# Patient Record
Sex: Female | Born: 1955 | Race: White | Hispanic: No | Marital: Married | Smoking: Never smoker
Health system: Southern US, Community
[De-identification: ages and names within clinical notes are randomized; demographics above are authoritative.]

## PROBLEM LIST (undated history)

## (undated) DIAGNOSIS — T7840XA Allergy, unspecified, initial encounter: Secondary | ICD-10-CM

## (undated) DIAGNOSIS — D219 Benign neoplasm of connective and other soft tissue, unspecified: Secondary | ICD-10-CM

## (undated) DIAGNOSIS — O00109 Unspecified tubal pregnancy without intrauterine pregnancy: Secondary | ICD-10-CM

## (undated) HISTORY — DX: Benign neoplasm of connective and other soft tissue, unspecified: D21.9

## (undated) HISTORY — PX: OOPHORECTOMY: SHX86

## (undated) HISTORY — DX: Allergy, unspecified, initial encounter: T78.40XA

## (undated) HISTORY — PX: DILATION AND CURETTAGE OF UTERUS: SHX78

## (undated) HISTORY — PX: ABDOMINAL HYSTERECTOMY: SHX81

## (undated) HISTORY — PX: OTHER SURGICAL HISTORY: SHX169

## (undated) HISTORY — DX: Unspecified tubal pregnancy without intrauterine pregnancy: O00.109

## (undated) HISTORY — PX: WISDOM TOOTH EXTRACTION: SHX21

---

## 2005-12-07 ENCOUNTER — Ambulatory Visit: Payer: Self-pay | Admitting: Family Medicine

## 2017-01-11 DIAGNOSIS — R030 Elevated blood-pressure reading, without diagnosis of hypertension: Secondary | ICD-10-CM | POA: Insufficient documentation

## 2021-04-23 ENCOUNTER — Encounter: Payer: Self-pay | Admitting: Nurse Practitioner

## 2021-04-23 DIAGNOSIS — R7989 Other specified abnormal findings of blood chemistry: Secondary | ICD-10-CM | POA: Insufficient documentation

## 2021-04-23 DIAGNOSIS — E7800 Pure hypercholesterolemia, unspecified: Secondary | ICD-10-CM | POA: Insufficient documentation

## 2021-05-08 ENCOUNTER — Other Ambulatory Visit: Payer: Self-pay

## 2021-05-08 ENCOUNTER — Ambulatory Visit: Payer: Commercial Managed Care - PPO | Admitting: Nurse Practitioner

## 2021-05-08 ENCOUNTER — Encounter: Payer: Self-pay | Admitting: Nurse Practitioner

## 2021-05-08 VITALS — BP 132/80 | Temp 98.6°F | Ht 65.0 in | Wt 151.0 lb

## 2021-05-08 DIAGNOSIS — Z1211 Encounter for screening for malignant neoplasm of colon: Secondary | ICD-10-CM

## 2021-05-08 DIAGNOSIS — E7800 Pure hypercholesterolemia, unspecified: Secondary | ICD-10-CM | POA: Diagnosis not present

## 2021-05-08 DIAGNOSIS — Z7689 Persons encountering health services in other specified circumstances: Secondary | ICD-10-CM

## 2021-05-08 DIAGNOSIS — L600 Ingrowing nail: Secondary | ICD-10-CM

## 2021-05-08 DIAGNOSIS — Z1283 Encounter for screening for malignant neoplasm of skin: Secondary | ICD-10-CM

## 2021-05-08 DIAGNOSIS — R030 Elevated blood-pressure reading, without diagnosis of hypertension: Secondary | ICD-10-CM

## 2021-05-08 DIAGNOSIS — Z1231 Encounter for screening mammogram for malignant neoplasm of breast: Secondary | ICD-10-CM

## 2021-05-08 DIAGNOSIS — Z780 Asymptomatic menopausal state: Secondary | ICD-10-CM

## 2021-05-08 MED ORDER — SHINGRIX 50 MCG/0.5ML IM SUSR: 0.5000 mL | Freq: Once | INTRAMUSCULAR | 0 refills | Status: AC

## 2021-05-08 NOTE — Assessment & Plan Note (Signed)
Wishes referral to Dr. Sharlotte Alamo at [facility] to address, she has seen him before.

## 2021-05-08 NOTE — Assessment & Plan Note (Signed)
Chronic, ongoing issue on review of chart, she checks BP at home and is often <130/80.  Stable on check today.  Recommend she continue to monitor at home and focus on DASH diet.  Return in 4 weeks for physical.

## 2021-05-08 NOTE — Progress Notes (Signed)
New Patient Office Visit  Subjective:  Patient ID: Christina Manning, female    DOB: [birth year]  Age: 65 y.o. MRN: [id number]  CC:  Chief Complaint  Patient presents with   Establish Care    Patient states she is here to have her screenings such as mammograms and etc ordered updated as COVID has put her behind as far as being on track with her health. Patient states she is not concerned much about blood pressure and etc as she is more concerned about mammogram and bone density. Patient states the last PCP was 3 years ago.     HPI Christina Manning presents for new patient visit to establish care.  Introduced to Designer, jewellery role and practice setting.  All questions answered.  Discussed provider/patient relationship and expectations.  Has not seen PCP in 3 years, got set behind due to Covid.  She would like full lab work -- physical.  She prefers not to take medications for anything, prefers supplements.  Rides horses daily and states dentist told her she had some bone loss in jaw, so would like bone density testing.    She requests referral to go back and see Dr. Sharlotte Alamo for her ingrown toenails and dermatology referral.    Has history of white coat hypertension noted on past notes and problem list, is refusing blood pressure check today by CMA or provider.  At length discussion on this.  Last followed by [facility] -- last labs reviewed from 2018 noting LDL 128, TSH 3.624, WBC 4.1.  Last mammogram was in 2018 and she wishes to repeat this.  Past Medical History:  Diagnosis Date   Ectopic pregnancy, tubal    Fibroids     Past Surgical History:  Procedure Laterality Date   ABDOMINAL HYSTERECTOMY     DILATION AND CURETTAGE OF UTERUS     Fallopian Tube Surgery     OOPHORECTOMY     WISDOM TOOTH EXTRACTION      Family History  Problem Relation Age of Onset   Mental illness Mother    Non-Hodgkin's lymphoma Mother    Brain cancer Mother    Lupus Father    Healthy  Brother    Heart Problems Daughter    Healthy Son    Healthy Son     Social History   Socioeconomic History   Marital status: Married    Spouse name: Not on file   Number of children: 3   Years of education: Not on file   Highest education level: Not on file  Occupational History   Not on file  Tobacco Use   Smoking status: Never   Smokeless tobacco: Never  Substance and Sexual Activity   Alcohol use: Never   Drug use: Not Currently   Sexual activity: Not Currently  Other Topics Concern   Not on file  Social History Narrative   Not on file   Social Determinants of Health   Financial Resource Strain: Low Risk    Difficulty of Paying Living Expenses: Not hard at all  Food Insecurity: No Food Insecurity   Worried About Charity fundraiser in the Last Year: Never true   [place] in the Last Year: Never true  Transportation Needs: No Transportation Needs   Lack of Transportation (Medical): No   Lack of Transportation (Non-Medical): No  Physical Activity: Sufficiently Active   Days of Exercise per Week: 7 days   Minutes of Exercise per Session: 60  min  Stress: No Stress Concern Present   Feeling of Stress : Not at all  Social Connections: Moderately Isolated   Frequency of Communication with Friends and Family: More than three times a week   Frequency of Social Gatherings with Friends and Family: More than three times a week   Attends Religious Services: Never   Marine scientist or Organizations: No   Attends Music therapist: Never   Marital Status: Married  Human resources officer Violence: Not At Risk   Fear of Current or Ex-Partner: No   Emotionally Abused: No   Physically Abused: No   Sexually Abused: No    ROS Review of Systems  Constitutional:  Negative for activity change, appetite change, diaphoresis, fatigue and fever.  Respiratory:  Negative for cough, chest tightness and shortness of breath.   Cardiovascular:  Negative for chest  pain, palpitations and leg swelling.  Gastrointestinal:  Negative for abdominal distention, abdominal pain, constipation, diarrhea, nausea and vomiting.  Endocrine: Negative for cold intolerance, heat intolerance, polydipsia, polyphagia and polyuria.  Neurological:  Negative for dizziness, syncope, weakness, light-headedness, numbness and headaches.  Psychiatric/Behavioral: Negative.     Objective:   Today's Vitals: BP 132/80 (BP Location: Left Arm, Patient Position: Sitting)   Temp 98.6 F (37 C) (Oral)   Ht '5\' 5"'$  (1.651 m)   Wt 151 lb (68.5 kg)   BMI 25.13 kg/m   Physical Exam Vitals and nursing note reviewed.  Constitutional:      General: She is awake. She is not in acute distress.    Appearance: She is well-developed and well-groomed. She is not ill-appearing or toxic-appearing.  HENT:     Head: Normocephalic.     Right Ear: Hearing normal.     Left Ear: Hearing normal.     Nose: Nose normal.  Eyes:     General: Lids are normal.        Right eye: No discharge.        Left eye: No discharge.     Conjunctiva/sclera: Conjunctivae normal.     Pupils: Pupils are equal, round, and reactive to light.  Neck:     Thyroid: No thyromegaly.     Vascular: No carotid bruit.  Cardiovascular:     Rate and Rhythm: Normal rate and regular rhythm.     Heart sounds: Normal heart sounds. No murmur heard.   No gallop.  Pulmonary:     Effort: Pulmonary effort is normal. No accessory muscle usage or respiratory distress.     Breath sounds: Normal breath sounds.  Abdominal:     General: Bowel sounds are normal.     Palpations: Abdomen is soft.  Musculoskeletal:     Cervical back: Normal range of motion and neck supple.     Right lower leg: No edema.     Left lower leg: No edema.  Skin:    General: Skin is warm and dry.  Neurological:     Mental Status: She is alert and oriented to person, place, and time.  Psychiatric:        Attention and Perception: Attention normal.        Mood  and Affect: Mood normal.        Speech: Speech normal.        Behavior: Behavior normal. Behavior is cooperative.        Thought Content: Thought content normal.    Assessment & Plan:   Problem List Items Addressed This Visit  Cardiovascular and Mediastinum   White coat syndrome without diagnosis of hypertension    Chronic, ongoing issue on review of chart, she checks BP at home and is often <130/80.  Stable on check today.  Recommend she continue to monitor at home and focus on DASH diet.  Return in 4 weeks for physical.        Musculoskeletal and Integument   Ingrown toenail of both feet    Wishes referral to Dr. Sharlotte Alamo at [facility] to address, she has seen him before.      Relevant Orders   Ambulatory referral to Podiatry     Other   Elevated low density lipoprotein (LDL) cholesterol level    Noted on past labs from 2018, recommend diet focus at home and will check fasting labs at physical in 4 weeks.      Skin exam, screening for cancer    Referral to dermatology per patient request to continue annual screening.      Relevant Orders   Ambulatory referral to Dermatology   Other Visit Diagnoses     Encounter to establish care    -  Primary   Postmenopausal estrogen deficiency       DEXA scan ordered per patient request.   Relevant Orders   DG Bone Density   Encounter for screening mammogram for malignant neoplasm of breast       Mammogram ordered.   Relevant Orders   MM 3D SCREEN BREAST BILATERAL   Colon cancer screening       Cologuard ordered and discussed with patient.   Relevant Orders   Cologuard       Outpatient Encounter Medications as of 05/08/2021  Medication Sig   ascorbic acid (VITAMIN C) 500 MG tablet Take by mouth.   Coenzyme Q10 10 MG capsule Take by mouth.   glucosamine-chondroitin 500-400 MG tablet Take by mouth.   Magnesium Gluconate 550 MG TABS Take by mouth.   Multiple Vitamin (MULTI-VITAMINS) TABS Take 1 tablet by mouth  daily.   Omega-3 1000 MG CAPS Take by mouth.   Turmeric, Curcuma Longa, (CURCUMIN) POWD 2 capsules by Miscellaneous route Two (2) times a day.   Zoster Vaccine Adjuvanted [facility]) injection Inject 0.5 mLs into the muscle once for 1 dose. Dose # 1   [START ON 10/14/2021] Zoster Vaccine Adjuvanted [facility]) injection Inject 0.5 mLs into the muscle once for 1 dose. Dose # 2   No facility-administered encounter medications on file as of 05/08/2021.    Follow-up: Return in about 4 weeks (around 06/05/2021) for Annual physical.   Venita Lick, NP

## 2021-05-08 NOTE — Patient Instructions (Signed)
[facility]  Address: [address], [place], [place] [address]  Phone: [phone]  Mammogram A mammogram is an X-ray of the breasts. This is done to check for changes that are not normal. This test can look for changes that may be caused by breast cancer or other problems. Mammograms are regularly done on women beginning at age 64. A man may have a mammogram if he has a lump or swelling in his breast. Tell a doctor: About any allergies you have. If you have breast implants. If you have had breast disease, biopsy, or surgery. If you have a family history of breast cancer. If you are breastfeeding. Whether you are pregnant or may be pregnant. What are the risks? Generally, this is a safe procedure. But problems may occur, including: Being exposed to radiation. Radiation levels are very low with this test. The need for more tests. The results were not read properly. Trouble finding breast cancer in women with dense breasts. What happens before the test? Have this test done about 1-2 weeks after your menstrual period. This is often when your breasts are the least tender. If you are visiting a new doctor or clinic, have any past mammogram images sent to your new doctor's office. Wash your breasts and under your arms on the day of the test. Do not use deodorants, perfumes, lotions, or powders on the day of the test. Take off any jewelry from your neck. Wear clothes that you can change into and out of easily. What happens during the test?  You will take off your clothes from the waist up. You will put on a gown. You will stand in front of the X-ray machine. Each breast will be placed between two plastic or glass plates. The plates will press down on your breast for a few seconds. Try to relax. This does not cause any harm to your breasts. It may not feel comfortable, but it will be very brief. X-rays will be taken from different angles of each  breast. The procedure may vary among doctors and hospitals. What can I expect after the test? The mammogram will be read by a specialist (radiologist). You may need to do parts of the test again. This depends on the quality of the images. You may go back to your normal activities. It is up to you to get the results of your test. Ask how to get your results when they are ready. Summary A mammogram is an X-ray of the breasts. It looks for changes that may be caused by breast cancer or other problems. A man may have this test if he has a lump or swelling in his breast. Before the test, tell your doctor about any breast problems that you have had in the past. Have this test done about 1-2 weeks after your menstrual period. Ask when your test results will be ready. Make sure you get your test results. This information is not intended to replace advice given to you by your health care provider. Make sure you discuss any questions you have with your health care provider. Document Revised: 07/14/2020 Document Reviewed: 07/14/2020 Elsevier Patient Education  2022 Elsevier Inc.   

## 2021-05-08 NOTE — Assessment & Plan Note (Signed)
Noted on past labs from 2018, recommend diet focus at home and will check fasting labs at physical in 4 weeks.

## 2021-05-08 NOTE — Assessment & Plan Note (Signed)
Referral to dermatology per patient request to continue annual screening.

## 2021-05-25 LAB — COLOGUARD: Cologuard: NEGATIVE

## 2021-05-25 NOTE — Progress Notes (Signed)
Contacted via MyChart   Good morning Tehani, your Cologuard returned negative.  This means next screening will be in 3 years.  Great news!!! Keep being stellar!!  Thank you for allowing me to participate in your care.  I appreciate you. Kindest regards, Raylei Losurdo

## 2021-06-10 ENCOUNTER — Other Ambulatory Visit: Payer: Self-pay

## 2021-06-10 ENCOUNTER — Encounter: Payer: Self-pay | Admitting: Nurse Practitioner

## 2021-06-10 ENCOUNTER — Ambulatory Visit (INDEPENDENT_AMBULATORY_CARE_PROVIDER_SITE_OTHER): Payer: Commercial Managed Care - PPO | Admitting: Nurse Practitioner

## 2021-06-10 VITALS — BP 130/80 | HR 88 | Resp 18 | Ht 64.0 in | Wt 148.0 lb

## 2021-06-10 DIAGNOSIS — Z0000 Encounter for general adult medical examination without abnormal findings: Secondary | ICD-10-CM | POA: Diagnosis not present

## 2021-06-10 DIAGNOSIS — E7800 Pure hypercholesterolemia, unspecified: Secondary | ICD-10-CM | POA: Diagnosis not present

## 2021-06-10 DIAGNOSIS — R030 Elevated blood-pressure reading, without diagnosis of hypertension: Secondary | ICD-10-CM

## 2021-06-10 LAB — MICROALBUMIN, URINE WAIVED
Creatinine, Urine Waived: 200 mg/dL (ref 10–300)
Microalb, Ur Waived: 80 mg/L — ABNORMAL HIGH (ref 0–19)

## 2021-06-10 NOTE — Progress Notes (Signed)
BP 130/80 (BP Location: Left Arm, Patient Position: Sitting, Cuff Size: Normal)   Pulse 88 Comment: apical  Resp 18   Ht _0  (1.626 m)   Wt 148 lb (67.1 kg)   BMI 25.40 kg/m    Subjective:    Patient ID: Christina Manning, female    DOB: 1956-05-17, 65 y.o.   MRN: [id number]  HPI: Christina Manning is a 65 y.o. female presenting on 06/10/2021 for comprehensive medical examination. Current medical complaints include:none  She currently lives with: husband Menopausal Symptoms: no  BP checks at home average = 111/67 to 159/79 (elevated one was after being stung by bee) -- average 110/70  She would like inflammatory markers checked today -- due to random pain relieved by exercise.  Depression Screen done today and results listed below:  Depression screen [facility] 2/9 06/10/2021 05/08/2021  Decreased Interest 0 0  Down, Depressed, Hopeless 0 1  PHQ - 2 Score 0 1    Fall Risk  06/10/2021 06/10/2021  Falls in the past year? 0 0  Number falls in past yr: 0 0  Injury with Fall? 0 0  Risk for fall due to : No Fall Risks No Fall Risks  Follow up Falls prevention discussed Falls evaluation completed    Functional Status Survey: Is the patient deaf or have difficulty hearing?: No Does the patient have difficulty seeing, even when wearing glasses/contacts?: No Does the patient have difficulty concentrating, remembering, or making decisions?: No Does the patient have difficulty walking or climbing stairs?: No Does the patient have difficulty dressing or bathing?: No Does the patient have difficulty doing errands alone such as visiting a doctor's office or shopping?: No   Past Medical History:  Past Medical History:  Diagnosis Date   Ectopic pregnancy, tubal    Fibroids     Surgical History:  Past Surgical History:  Procedure Laterality Date   ABDOMINAL HYSTERECTOMY     DILATION AND CURETTAGE OF UTERUS     Fallopian Tube Surgery     OOPHORECTOMY     WISDOM TOOTH EXTRACTION       Medications:  Current Outpatient Medications on File Prior to Visit  Medication Sig   ascorbic acid (VITAMIN C) 500 MG tablet Take by mouth.   Coenzyme Q10 10 MG capsule Take by mouth.   glucosamine-chondroitin 500-400 MG tablet Take by mouth.   Magnesium Gluconate 550 MG TABS Take by mouth.   Multiple Vitamin (MULTI-VITAMINS) TABS Take 1 tablet by mouth daily.   Omega-3 1000 MG CAPS Take by mouth.   Turmeric, Curcuma Longa, (CURCUMIN) POWD 2 capsules by Miscellaneous route Two (2) times a day.   [START ON 10/14/2021] Zoster Vaccine Adjuvanted [facility]) injection Inject 0.5 mLs into the muscle once for 1 dose. Dose # 2   No current facility-administered medications on file prior to visit.    Allergies:  Allergies  Allergen Reactions   Sulfa Antibiotics Swelling    Social History:  Social History   Socioeconomic History   Marital status: Married    Spouse name: Not on file   Number of children: 3   Years of education: Not on file   Highest education level: Not on file  Occupational History   Not on file  Tobacco Use   Smoking status: Never   Smokeless tobacco: Never  Substance and Sexual Activity   Alcohol use: Never   Drug use: Not Currently   Sexual activity: Not Currently  Other Topics Concern  Not on file  Social History Narrative   Not on file   Social Determinants of Health   Financial Resource Strain: Low Risk    Difficulty of Paying Living Expenses: Not hard at all  Food Insecurity: No Food Insecurity   Worried About Programme researcher, broadcasting/film/video in the Last Year: Never true   Barista in the Last Year: Never true  Transportation Needs: No Transportation Needs   Lack of Transportation (Medical): No   Lack of Transportation (Non-Medical): No  Physical Activity: Sufficiently Active   Days of Exercise per Week: 7 days   Minutes of Exercise per Session: 60 min  Stress: No Stress Concern Present   Feeling of Stress : Not at all  Social Connections:  Moderately Isolated   Frequency of Communication with Friends and Family: More than three times a week   Frequency of Social Gatherings with Friends and Family: More than three times a week   Attends Religious Services: Never   Database administrator or Organizations: No   Attends Engineer, structural: Never   Marital Status: Married  Catering manager Violence: Not At Risk   Fear of Current or Ex-Partner: No   Emotionally Abused: No   Physically Abused: No   Sexually Abused: No   Social History   Tobacco Use  Smoking Status Never  Smokeless Tobacco Never   Social History   Substance and Sexual Activity  Alcohol Use Never    Family History:  Family History  Problem Relation Age of Onset   Mental illness Mother    Non-Hodgkin's lymphoma Mother    Brain cancer Mother    Lupus Father    Healthy Brother    Heart Problems Daughter    Healthy Son    Healthy Son     Past medical history, surgical history, medications, allergies, family history and social history reviewed with patient today and changes made to appropriate areas of the chart.   ROS All other ROS negative except what is listed above and in the HPI.      Objective:    BP 130/80 (BP Location: Left Arm, Patient Position: Sitting, Cuff Size: Normal)   Pulse 88 Comment: apical  Resp 18   Ht 5\' 4"  (1.626 m)   Wt 148 lb (67.1 kg)   BMI 25.40 kg/m   Wt Readings from Last 3 Encounters:  06/10/21 148 lb (67.1 kg)  05/08/21 151 lb (68.5 kg)    Physical Exam Vitals and nursing note reviewed.  Constitutional:      General: She is awake. She is not in acute distress.    Appearance: She is well-developed and well-groomed. She is not ill-appearing or toxic-appearing.  HENT:     Head: Normocephalic and atraumatic.     Right Ear: Hearing, tympanic membrane, ear canal and external ear normal. No drainage.     Left Ear: Hearing, tympanic membrane, ear canal and external ear normal. No drainage.     Nose:  Nose normal.     Right Sinus: No maxillary sinus tenderness or frontal sinus tenderness.     Left Sinus: No maxillary sinus tenderness or frontal sinus tenderness.     Mouth/Throat:     Mouth: Mucous membranes are moist.     Pharynx: Oropharynx is clear. Uvula midline. No pharyngeal swelling, oropharyngeal exudate or posterior oropharyngeal erythema.  Eyes:     General: Lids are normal.        Right eye: No discharge.  Left eye: No discharge.     Extraocular Movements: Extraocular movements intact.     Conjunctiva/sclera: Conjunctivae normal.     Pupils: Pupils are equal, round, and reactive to light.     Visual Fields: Right eye visual fields normal and left eye visual fields normal.  Neck:     Thyroid: No thyromegaly.     Vascular: No carotid bruit.     Trachea: Trachea normal.  Cardiovascular:     Rate and Rhythm: Normal rate and regular rhythm.     Heart sounds: Normal heart sounds. No murmur heard.   No gallop.  Pulmonary:     Effort: Pulmonary effort is normal. No accessory muscle usage or respiratory distress.     Breath sounds: Normal breath sounds.  Chest:     Comments: Deferred, going for mammogram on 28th. Abdominal:     General: Bowel sounds are normal.     Palpations: Abdomen is soft. There is no hepatomegaly or splenomegaly.     Tenderness: There is no abdominal tenderness.  Musculoskeletal:        General: Normal range of motion.     Cervical back: Normal range of motion and neck supple.     Right lower leg: No edema.     Left lower leg: No edema.  Lymphadenopathy:     Head:     Right side of head: No submental, submandibular, tonsillar, preauricular or posterior auricular adenopathy.     Left side of head: No submental, submandibular, tonsillar, preauricular or posterior auricular adenopathy.     Cervical: No cervical adenopathy.  Skin:    General: Skin is warm and dry.     Capillary Refill: Capillary refill takes less than 2 seconds.     Findings: No  rash.  Neurological:     Mental Status: She is alert and oriented to person, place, and time.     Cranial Nerves: Cranial nerves are intact.     Gait: Gait is intact.     Deep Tendon Reflexes: Reflexes are normal and symmetric.     Reflex Scores:      Brachioradialis reflexes are 2+ on the right side and 2+ on the left side.      Patellar reflexes are 2+ on the right side and 2+ on the left side. Psychiatric:        Attention and Perception: Attention normal.        Mood and Affect: Mood normal.        Speech: Speech normal.        Behavior: Behavior normal. Behavior is cooperative.        Thought Content: Thought content normal.        Judgment: Judgment normal.    Results for orders placed or performed in visit on 05/08/21  Cologuard  Result Value Ref Range   Cologuard Negative Negative      Assessment & Plan:   Problem List Items Addressed This Visit       Cardiovascular and Mediastinum   White coat syndrome without diagnosis of hypertension - Primary    Chronic, ongoing issue on review of chart, she checks BP at home and is often <130/80.  Stable on check today.  Recommend she continue to monitor at home and focus on DASH diet.  LABS today: CBC, CMP, TSH, lipid, CRP, ESR, urine ALB.  Initiate medication as needed.      Relevant Orders   Microalbumin, Urine Waived   CBC with Differential/Platelet   Comprehensive  metabolic panel   TSH   C-reactive protein   Sed Rate (ESR)     Other   Elevated low density lipoprotein (LDL) cholesterol level    Noted on past labs from 2018, recommend diet focus at home and regular activity.  Lipid panel today.      Relevant Orders   Lipid Panel w/o Chol/HDL Ratio   Other Visit Diagnoses     Annual physical exam       Annual labs today, CBC, CMP, TSH, lipid.  Health maintenance reviewed.        Follow up plan: Return in about 1 year (around 06/10/2022) for Annual physical.   LABORATORY TESTING:  - Pap smear: not  applicable  IMMUNIZATIONS:   - Tdap: Tetanus vaccination status reviewed: waiting until next year. - Influenza: Postponed to flu season - Pneumovax: Not applicable -- start at 30 - Prevnar: Not applicable - HPV: Not applicable - Zostavax vaccine:  sent to pharmacy  SCREENING: -Mammogram: Up to date -- scheduled 06/24/21 - Colonoscopy: Up to date -- cologuard -- 05/18/21 - Bone Density: Up to date -- scheduled 06/24/21 -Hearing Test: Not applicable  -Spirometry: Not applicable   PATIENT COUNSELING:   Advised to take 1 mg of folate supplement per day if capable of pregnancy.   Sexuality: Discussed sexually transmitted diseases, partner selection, use of condoms, avoidance of unintended pregnancy  and contraceptive alternatives.   Advised to avoid cigarette smoking.  I discussed with the patient that most people either abstain from alcohol or drink within safe limits (<=14/week and <=4 drinks/occasion for males, <=7/weeks and <= 3 drinks/occasion for females) and that the risk for alcohol disorders and other health effects rises proportionally with the number of drinks per week and how often a drinker exceeds daily limits.  Discussed cessation/primary prevention of drug use and availability of treatment for abuse.   Diet: Encouraged to adjust caloric intake to maintain  or achieve ideal body weight, to reduce intake of dietary saturated fat and total fat, to limit sodium intake by avoiding high sodium foods and not adding table salt, and to maintain adequate dietary potassium and calcium preferably from fresh fruits, vegetables, and low-fat dairy products.    Stressed the importance of regular exercise  Injury prevention: Discussed safety belts, safety helmets, smoke detector, smoking near bedding or upholstery.   Dental health: Discussed importance of regular tooth brushing, flossing, and dental visits.    NEXT PREVENTATIVE PHYSICAL DUE IN 1 YEAR. Return in about 1 year (around  06/10/2022) for Annual physical.

## 2021-06-10 NOTE — Patient Instructions (Addendum)
VACCINES -- start with Covid, then after that get flu, and then after get shingrix, and then tetanus. Pneumonia vaccines to start at age 64.  Healthy Eating Following a healthy eating pattern may help you to achieve and maintain a healthy body weight, reduce the risk of chronic disease, and live a long and productive life. It is important to follow a healthy eating pattern at an appropriate calorie level for your body. Your nutritional needs should be met primarily through food by choosing a variety of nutrient-rich foods. What are tips for following this plan? Reading food labels Read labels and choose the following: Reduced or low sodium. Juices with 100% fruit juice. Foods with low saturated fats and high polyunsaturated and monounsaturated fats. Foods with whole grains, such as whole wheat, cracked wheat, brown rice, and wild rice. Whole grains that are fortified with folic acid. This is recommended for women who are pregnant or who want to become pregnant. Read labels and avoid the following: Foods with a lot of added sugars. These include foods that contain brown sugar, corn sweetener, corn syrup, dextrose, fructose, glucose, high-fructose corn syrup, honey, invert sugar, lactose, malt syrup, maltose, molasses, raw sugar, sucrose, trehalose, or turbinado sugar. Do not eat more than the following amounts of added sugar per day: 6 teaspoons (25 g) for women. 9 teaspoons (38 g) for men. Foods that contain processed or refined starches and grains. Refined grain products, such as white flour, degermed cornmeal, white bread, and white rice. Shopping Choose nutrient-rich snacks, such as vegetables, whole fruits, and nuts. Avoid high-calorie and high-sugar snacks, such as potato chips, fruit snacks, and candy. Use oil-based dressings and spreads on foods instead of solid fats such as butter, stick margarine, or cream cheese. Limit pre-made sauces, mixes, and "instant" products such as flavored  rice, instant noodles, and ready-made pasta. Try more plant-protein sources, such as tofu, tempeh, black beans, edamame, lentils, nuts, and seeds. Explore eating plans such as the Mediterranean diet or vegetarian diet. Cooking Use oil to saut or stir-fry foods instead of solid fats such as butter, stick margarine, or lard. Try baking, boiling, grilling, or broiling instead of frying. Remove the fatty part of meats before cooking. Steam vegetables in water or broth. Meal planning  At meals, imagine dividing your plate into fourths: One-half of your plate is fruits and vegetables. One-fourth of your plate is whole grains. One-fourth of your plate is protein, especially lean meats, poultry, eggs, tofu, beans, or nuts. Include low-fat dairy as part of your daily diet. Lifestyle Choose healthy options in all settings, including home, work, school, restaurants, or stores. Prepare your food safely: Wash your hands after handling raw meats. Keep food preparation surfaces clean by regularly washing with hot, soapy water. Keep raw meats separate from ready-to-eat foods, such as fruits and vegetables. Cook seafood, meat, poultry, and eggs to the recommended internal temperature. Store foods at safe temperatures. In general: Keep cold foods at 22F (4.4C) or below. Keep hot foods at 122F (60C) or above. Keep your freezer at [facility] (-17.8C) or below. Foods are no longer safe to eat when they have been between the temperatures of 40-122F (4.4-60C) for more than 2 hours. What foods should I eat? Fruits Aim to eat 2 cup-equivalents of fresh, canned (in natural juice), or frozen fruits each day. Examples of 1 cup-equivalent of fruit include 1 small apple, 8 large strawberries, 1 cup canned fruit,  cup dried fruit, or 1 cup 100% juice. Vegetables Aim to eat 2-3  cup-equivalents of fresh and frozen vegetables each day, including different varieties and colors. Examples of 1 cup-equivalent of  vegetables include 2 medium carrots, 2 cups raw, leafy greens, 1 cup chopped vegetable (raw or cooked), or 1 medium baked potato. Grains Aim to eat 6 ounce-equivalents of whole grains each day. Examples of 1 ounce-equivalent of grains include 1 slice of bread, 1 cup ready-to-eat cereal, 3 cups popcorn, or  cup cooked rice, pasta, or cereal. Meats and other proteins Aim to eat 5-6 ounce-equivalents of protein each day. Examples of 1 ounce-equivalent of protein include 1 egg, 1/2 cup nuts or seeds, or 1 tablespoon (16 g) peanut butter. A cut of meat or fish that is the size of a deck of cards is about 3-4 ounce-equivalents. Of the protein you eat each week, try to have at least 8 ounces come from seafood. This includes salmon, trout, herring, and anchovies. Dairy Aim to eat 3 cup-equivalents of fat-free or low-fat dairy each day. Examples of 1 cup-equivalent of dairy include 1 cup (240 mL) milk, 8 ounces (250 g) yogurt, 1 ounces (44 g) natural cheese, or 1 cup (240 mL) fortified soy milk. Fats and oils Aim for about 5 teaspoons (21 g) per day. Choose monounsaturated fats, such as canola and olive oils, avocados, peanut butter, and most nuts, or polyunsaturated fats, such as sunflower, corn, and soybean oils, walnuts, pine nuts, sesame seeds, sunflower seeds, and flaxseed. Beverages Aim for six 8-oz glasses of water per day. Limit coffee to three to five 8-oz cups per day. Limit caffeinated beverages that have added calories, such as soda and energy drinks. Limit alcohol intake to no more than 1 drink a day for nonpregnant women and 2 drinks a day for men. One drink equals 12 oz of beer (355 mL), 5 oz of wine (148 mL), or 1 oz of hard liquor (44 mL). Seasoning and other foods Avoid adding excess amounts of salt to your foods. Try flavoring foods with herbs and spices instead of salt. Avoid adding sugar to foods. Try using oil-based dressings, sauces, and spreads instead of solid fats. This  information is based on general [place] nutrition guidelines. For more information, visit [employer]. Exact amounts may vary based on your nutrition needs. Summary A healthy eating plan may help you to maintain a healthy weight, reduce the risk of chronic diseases, and stay active throughout your life. Plan your meals. Make sure you eat the right portions of a variety of nutrient-rich foods. Try baking, boiling, grilling, or broiling instead of frying. Choose healthy options in all settings, including home, work, school, restaurants, or stores. This information is not intended to replace advice given to you by your health care provider. Make sure you discuss any questions you have with your health care provider. Document Revised: 12/26/2017 Document Reviewed: 12/26/2017 Elsevier Patient Education  [employer].

## 2021-06-10 NOTE — Assessment & Plan Note (Addendum)
Chronic, ongoing issue on review of chart, she checks BP at home and is often <130/80.  Stable on check today.  Recommend she continue to monitor at home and focus on DASH diet.  LABS today: CBC, CMP, TSH, lipid, CRP, ESR, urine ALB.  Initiate medication as needed.

## 2021-06-10 NOTE — Assessment & Plan Note (Signed)
Noted on past labs from 2018, recommend diet focus at home and regular activity.  Lipid panel today.

## 2021-06-11 LAB — CBC WITH DIFFERENTIAL/PLATELET
Basophils Absolute: 0 10*3/uL (ref 0.0–0.2)
Basos: 1 %
EOS (ABSOLUTE): 0.1 10*3/uL (ref 0.0–0.4)
Eos: 3 %
Hematocrit: 42.9 % (ref 34.0–46.6)
Hemoglobin: 14.8 g/dL (ref 11.1–15.9)
Immature Grans (Abs): 0 10*3/uL (ref 0.0–0.1)
Immature Granulocytes: 0 %
Lymphocytes Absolute: 1.2 10*3/uL (ref 0.7–3.1)
Lymphs: 26 %
MCH: 32.7 pg (ref 26.6–33.0)
MCHC: 34.5 g/dL (ref 31.5–35.7)
MCV: 95 fL (ref 79–97)
Monocytes Absolute: 0.6 10*3/uL (ref 0.1–0.9)
Monocytes: 13 %
Neutrophils Absolute: 2.8 10*3/uL (ref 1.4–7.0)
Neutrophils: 57 %
Platelets: 223 10*3/uL (ref 150–450)
RBC: 4.53 x10E6/uL (ref 3.77–5.28)
RDW: 12.5 % (ref 11.7–15.4)
WBC: 4.8 10*3/uL (ref 3.4–10.8)

## 2021-06-11 LAB — COMPREHENSIVE METABOLIC PANEL
ALT: 24 IU/L (ref 0–32)
AST: 27 IU/L (ref 0–40)
Albumin/Globulin Ratio: 2.6 — ABNORMAL HIGH (ref 1.2–2.2)
Albumin: 5.5 g/dL — ABNORMAL HIGH (ref 3.8–4.8)
Alkaline Phosphatase: 72 IU/L (ref 44–121)
BUN/Creatinine Ratio: 17 (ref 12–28)
BUN: 14 mg/dL (ref 8–27)
Bilirubin Total: 0.7 mg/dL (ref 0.0–1.2)
CO2: 26 mmol/L (ref 20–29)
Calcium: 10.5 mg/dL — ABNORMAL HIGH (ref 8.7–10.3)
Chloride: 100 mmol/L (ref 96–106)
Creatinine, Ser: 0.84 mg/dL (ref 0.57–1.00)
Globulin, Total: 2.1 g/dL (ref 1.5–4.5)
Glucose: 95 mg/dL (ref 65–99)
Potassium: 4.2 mmol/L (ref 3.5–5.2)
Sodium: 142 mmol/L (ref 134–144)
Total Protein: 7.6 g/dL (ref 6.0–8.5)
eGFR: 78 mL/min/{1.73_m2} (ref >59)

## 2021-06-11 LAB — LIPID PANEL W/O CHOL/HDL RATIO
Cholesterol, Total: 273 mg/dL — ABNORMAL HIGH (ref 100–199)
HDL: 44 mg/dL (ref >39)
LDL Chol Calc (NIH): 179 mg/dL — ABNORMAL HIGH (ref 0–99)
Triglycerides: 261 mg/dL — ABNORMAL HIGH (ref 0–149)
VLDL Cholesterol Cal: 50 mg/dL — ABNORMAL HIGH (ref 5–40)

## 2021-06-11 LAB — C-REACTIVE PROTEIN: CRP: <1 mg/L (ref 0–10)

## 2021-06-11 LAB — SEDIMENTATION RATE: Sed Rate: 2 mm/hr (ref 0–40)

## 2021-06-11 LAB — TSH: TSH: 3.69 u[IU]/mL (ref 0.450–4.500)

## 2021-06-11 NOTE — Progress Notes (Signed)
Contacted via MyChart The 10-year ASCVD risk score (Arnett DK, et al., 2019) is: 6.8%   Values used to calculate the score:     Age: 65 years     Sex: Female     Is Non-Hispanic African American: No     Diabetic: No     Tobacco smoker: No     Systolic Blood Pressure: 224 mmHg     Is BP treated: No     HDL Cholesterol: 44 mg/dL     Total Cholesterol: 273 mg/dL   Good evening Christina Manning, your labs have returned: - CBC is normal and shows no anemia - Kidney function, creatinine and eGFR, is normal.  Liver function, AST and ALT, normal. - Calcium mildly elevated, will continue to monitor this and recheck next visit. - Thyroid is normal.  Inflammation markers are normal. - Your cholesterol is still high, but continued recommendations to make lifestyle changes. Your LDL is above normal. The LDL is the bad cholesterol. Over time and in combination with inflammation and other factors, this contributes to plaque which in turn may lead to stroke and/or heart attack down the road. Sometimes high LDL is primarily genetic, and people might be eating all the right foods but still have high numbers. Other times, there is room for improvement in one's diet and eating healthier can bring this number down and potentially reduce one's risk of heart attack and/or stroke.   To reduce your LDL, Remember - more fruits and vegetables, more fish, and limit red meat and dairy products. More soy, nuts, beans, barley, lentils, oats and plant sterol ester enriched margarine instead of butter. I also encourage eliminating sugar and processed food. Remember, shop on the outside of the grocery store and visit your [employer]. If you would like to talk with me about dietary changes plus or minus medications for your cholesterol, please let me know. We should recheck your cholesterol in 6-12 months.  Any questions? Keep being amazing!!  Thank you for allowing me to participate in your care.  I appreciate  you. Kindest regards, Salome Cozby

## 2021-06-24 ENCOUNTER — Ambulatory Visit
Admission: RE | Admit: 2021-06-24 | Discharge: 2021-06-24 | Disposition: A | Discharge status: Home / Self Care | Payer: Commercial Managed Care - PPO | Source: Ambulatory Visit | Attending: Nurse Practitioner | Admitting: Nurse Practitioner

## 2021-06-24 ENCOUNTER — Other Ambulatory Visit: Payer: Self-pay

## 2021-06-24 DIAGNOSIS — Z1231 Encounter for screening mammogram for malignant neoplasm of breast: Secondary | ICD-10-CM | POA: Insufficient documentation

## 2021-06-24 DIAGNOSIS — Z780 Asymptomatic menopausal state: Secondary | ICD-10-CM | POA: Diagnosis not present

## 2021-06-25 ENCOUNTER — Encounter: Payer: Self-pay | Admitting: Nurse Practitioner

## 2021-06-25 DIAGNOSIS — M85832 Other specified disorders of bone density and structure, left forearm: Secondary | ICD-10-CM | POA: Insufficient documentation

## 2021-06-25 NOTE — Progress Notes (Signed)
Contacted via MyChart   Good morning!!  Your bone density shows thinning bones (osteopenia) but not brittle (osteoporosis). We recommend Vitamin D supplementation of about 2,0000 IUs of over the counter Vitamin D3. In addition, we recommend a diet high in calcium with dairy and dark green leafy vegetables. We would like you to get plenty of weight bearing exercises with walking and resistance training such as light weights or resistance bands available with instructions at places such as [employer]. Plan to repeat this scan in 5 years.  Any questions? Keep being stellar!!  Thank you for allowing me to participate in your care.  I appreciate you. Kindest regards, Jandi Swiger

## 2021-06-26 ENCOUNTER — Inpatient Hospital Stay
Admission: RE | Admit: 2021-06-26 | Discharge: 2021-06-26 | Disposition: A | Discharge status: Home / Self Care | Payer: Self-pay | Source: Ambulatory Visit | Attending: *Deleted | Admitting: *Deleted

## 2021-06-26 ENCOUNTER — Other Ambulatory Visit: Payer: Self-pay | Admitting: *Deleted

## 2021-06-26 DIAGNOSIS — Z1231 Encounter for screening mammogram for malignant neoplasm of breast: Secondary | ICD-10-CM

## 2021-06-30 ENCOUNTER — Other Ambulatory Visit: Payer: Self-pay | Admitting: Nurse Practitioner

## 2021-06-30 DIAGNOSIS — R928 Other abnormal and inconclusive findings on diagnostic imaging of breast: Secondary | ICD-10-CM

## 2021-06-30 DIAGNOSIS — N6489 Other specified disorders of breast: Secondary | ICD-10-CM

## 2021-07-01 ENCOUNTER — Encounter: Payer: Self-pay | Admitting: Dermatology

## 2021-07-09 ENCOUNTER — Ambulatory Visit
Admission: RE | Admit: 2021-07-09 | Discharge: 2021-07-09 | Disposition: A | Discharge status: Home / Self Care | Payer: Commercial Managed Care - PPO | Source: Ambulatory Visit | Attending: Nurse Practitioner | Admitting: Nurse Practitioner

## 2021-07-09 ENCOUNTER — Other Ambulatory Visit: Payer: Self-pay

## 2021-07-09 DIAGNOSIS — N6489 Other specified disorders of breast: Secondary | ICD-10-CM | POA: Insufficient documentation

## 2021-07-09 DIAGNOSIS — R928 Other abnormal and inconclusive findings on diagnostic imaging of breast: Secondary | ICD-10-CM

## 2021-08-24 ENCOUNTER — Encounter: Payer: Self-pay | Admitting: Nurse Practitioner

## 2021-09-11 ENCOUNTER — Ambulatory Visit: Payer: Self-pay | Admitting: *Deleted

## 2021-09-11 NOTE — Telephone Encounter (Signed)
°  Chief Complaint: pulled muscle in arm and tick bite red to right inner thigh  Symptoms: some pain in low back, hips, shoulder, hands. Tick removed 09/02/21 from right inner thigh. Redness noted 09/05/21 and itching.  Frequency: na Pertinent Negatives: Patient denies no fever Disposition: [] ED /[] Urgent Care (no appt availability in office) / [x] Appointment(In office/virtual)/ []   Virtual Care/ [] Home Care/ [] Refused Recommended Disposition  Additional Notes:  Appt scheduled for 09/14/21 .      Reason for Disposition  Wood tick bite with no complications  Answer Assessment - Initial Assessment Questions 1. TYPE of TICK: "Is it a wood tick or a deer tick?" (e.g., deer tick, wood tick; unsure)     Wood tick 2. SIZE of TICK: "How big is the tick?" (e.g., size of poppy seed, apple seed, watermelon seed; unsure) Note: Deer ticks can be the size of a poppy seed (nymph) or an apple seed (adult).       Almost black  3. ENGORGED: "Did the tick look flat or engorged (full, swollen)?" (e.g., flat, engorged; unsure)     Flat  4. LOCATION: "Where is the tick bite located?"      Left inner thigh 5. ONSET: "How long do you think the tick was attached before you removed it?" (e.g., 5 hours, 2 days)      Not sure 6. APPEARANCE of BITE or RASH: "What does the site look like?"     Red  7. PREGNANCY: "Is there any chance you are pregnant?" "When was your last menstrual period?"     na  Protocols used: Tick Bite-A-AH

## 2021-09-14 ENCOUNTER — Encounter: Payer: Self-pay | Admitting: Nurse Practitioner

## 2021-09-14 ENCOUNTER — Other Ambulatory Visit: Payer: Self-pay

## 2021-09-14 ENCOUNTER — Ambulatory Visit: Payer: Commercial Managed Care - PPO | Admitting: Nurse Practitioner

## 2021-09-14 ENCOUNTER — Ambulatory Visit: Payer: Self-pay | Admitting: Dermatology

## 2021-09-14 VITALS — BP 122/76 | HR 80 | Temp 98.4°F | Resp 18 | Wt 155.2 lb

## 2021-09-14 DIAGNOSIS — W57XXXA Bitten or stung by nonvenomous insect and other nonvenomous arthropods, initial encounter: Secondary | ICD-10-CM

## 2021-09-14 DIAGNOSIS — M79601 Pain in right arm: Secondary | ICD-10-CM | POA: Insufficient documentation

## 2021-09-14 DIAGNOSIS — S70361A Insect bite (nonvenomous), right thigh, initial encounter: Secondary | ICD-10-CM | POA: Insufficient documentation

## 2021-09-14 NOTE — Assessment & Plan Note (Signed)
Acute and overall improving at this time with no further symptoms.  Will obtain tick borne disease labs today and treat as needed based on results.  Discussed with patient.

## 2021-09-14 NOTE — Telephone Encounter (Signed)
Sent patient a message via MyChart.

## 2021-09-14 NOTE — Assessment & Plan Note (Signed)
Happened in September to right bicep with no pain or bruising to area.  At this time is stable, but obvious deformity present.  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

## 2021-09-14 NOTE — Patient Instructions (Signed)
Distal Biceps Tendon Tear Rehab Ask your health care provider which exercises are safe for you. Do exercises exactly as told by your health care provider and adjust them as directed. It is normal to feel mild stretching, pulling, tightness, or discomfort as you do these exercises. Stop right away if you feel sudden pain or your pain gets worse. Do not begin these exercises until told by your health care provider. Stretching and range-of-motion exercises These exercises warm up your muscles and joints and improve the movement and flexibility of your arm. The exercises can also help to relieve pain, numbness, and tingling. Passive elbow flexion  Stand or sit with your left / right arm at your side. Use your other hand to gently push your left / right hand toward your shoulder (flexion). Bend your elbow as far as your health care provider tells you to. You may be instructed to try to bend your elbow more and more over time. Hold this position for __________ seconds. Slowly return to the starting position. Repeat __________ times. Complete this exercise __________ times a day. Passive supination This exercise is sometimes called elbow extension stretch. Lie on your back in a comfortable position that allows you to relax your arm muscles. Place a folded towel under your left / right upper arm so your elbow and shoulder are at the same height. Use your other arm to raise your left / right arm until your elbow does not rest on the bed or towel. Hold your left / right arm out straight with your other hand supporting it. Let the weight of your hand stretch the inside of your elbow. Keep your arm and chest muscles relaxed. If directed, you may hold a __________ lb / kg weight in your hand to increase the intensity of the stretch. Hold this position for __________ seconds. Slowly release the stretch. Repeat __________ times. Complete this exercise __________ times a day. Passive supination This exercise  is sometimes called palms-up rotation stretch. Sit with your left / right elbow bent to a 90-degree angle (right angle) and your forearm resting on a table with your palm down. Keeping your upper body and shoulder still, use your other hand to rotate your left / right palm up (supination). Stop when you feel a gentle to moderate stretch. Hold this position for __________ seconds. Slowly return to the starting position. Repeat __________ times. Complete this exercise __________ times a day. Passive pronation This exercise is sometimes called palm-down rotation stretch. Sit with your left / right elbow bent to a 90-degree angle (right angle) and your forearm resting on a table with your palm up. Keeping your upper body and shoulder still, use your other hand to rotate your left / right palm down (pronation). Stop when you feel a gentle to moderate stretch. Hold this position for __________ seconds. Slowly return to the starting position. Repeat __________ times. Complete this exercise __________ times a day. Active elbow flexion Stand or sit with your left / right elbow bent and your palm facing in, toward your body. Bend your elbow as far as you can using only your arm muscles (flexion). Hold this position for __________ seconds. Slowly return to the starting position. Repeat __________ times. Complete this exercise __________ times a day. Active elbow extension Stand or sit with your left / right elbow bent and your palm facing in, toward your body. Using only your arm muscles, slowly straighten your elbow (extension). Stop when you feel a gentle stretch at the front  of your arm, or when you reach the position where your health care provider tells you to stop. You may be instructed to try to straighten your arm more and more each week. Hold this position for __________ seconds. Slowly return to the starting position. Repeat __________ times. Complete this exercise __________ times a  day. Active supination This exercise is sometimes called palm-up rotation of the forearm. Stand or sit with your left / right elbow bent to a 90-degree angle (right angle). Rotate your palm up (supination) until you feel a gentle stretch on the inside of your forearm. Hold this position for __________ seconds. Slowly return to the starting position. Repeat __________ times. Complete this exercise __________ times a day. Active pronation This exercise is sometimes called palm-down rotation of the forearm. Stand or sit with your left / right elbow bent to a 90-degree angle (right angle). Rotate your palm down (pronation) until you feel a gentle stretch on the top of your forearm. Hold this position for __________ seconds. Slowly return to the starting position. Repeat __________ times. Complete this exercise __________ times a day. Strengthening exercises These exercises build strength and endurance in your arm and shoulder. Endurance is the ability to use your muscles for a long time, even after they get tired. Do not start any strengthening exercises until told by your health care provider. Isometric elbow flexion Stand or sit with your left / right arm at waist height. Your palm should face in, toward your body. Place your other hand on top of your left / right forearm. Gently push down while you resist with your left / right arm (isometric). Use about 50% effort with both arms. You may be instructed to use more and more effort with your arms each week. Try not to let your left / right elbow move. Hold this position for __________ seconds. Let your muscles relax completely before you repeat the exercise. Repeat __________ times. Complete this exercise __________ times a day. Isometric elbow extension  Stand or sit with your left / right arm at waist height. Your palm should face in, toward your body. Place your other hand on the bottom of your left / right forearm. Gently push up while  you resist with your left / right arm (isometric). Use about 50% effort with both arms. You may be instructed to use more and more effort with your arms each week. Try not to let your left / right elbow move. Hold this position for __________ seconds. Let your muscles relax completely before you repeat this exercise. Repeat __________ times. Complete this exercise __________ times a day. Elbow flexion, supination This exercise is also called biceps curls. Sit on a stable chair without armrests, or stand up. Hold a __________ lb / kg weight in your left / right hand. Your palm should face out, away from your body, at the starting position (supination). Bend your left / right elbow and move your hand up toward your shoulder (flexion). Keep your elbow at your side while you bend it. Slowly return to the starting position. Repeat __________ times. Complete this exercise __________ times a day. Elbow flexion, neutral This exercise is also called hammer curls. Sit on a stable chair without armrests, or stand up. Hold a __________ lb / kg weight in your left / right hand, or hold an exercise band with both hands. Your palms should face each other at the starting position (neutral position). Keeping your other arm straight, bend your left / right elbow  and move your hand up toward your shoulder (flexion). Keep your elbow at your side while you bend it. Slowly return to the starting position. Repeat __________ times. Complete this exercise __________ times a day. Elbow extension with weight This exercise is also called triceps curls with weight. Lie on your back. Hold a __________ lb / kg weight in your left / right hand. Bend your left / right elbow to a 90-degree angle (right angle) so that the weight is in front of your face, over your chest, and your elbow is pointed up to the ceiling. Straighten your elbow, raising your hand toward the ceiling (extension). Use your other hand to support your  left / right upper arm and to keep it still. Slowly return to the starting position. Repeat __________ times. Complete this exercise __________ times a day. Active elbow extension with exercise band This exercise is sometimes called triceps curls with exercise band. Sit on a stable chair without armrests, or stand up. Hold an exercise band in both hands. Keeping your upper arms at your sides, bring both hands up to your shoulder. Keep your left / right hand just below your other hand. Straighten your left / right elbow (extension) while keeping your other arm still. Hold this position for __________ seconds. Slowly bend your elbow to return to the starting position. Repeat __________ times. Complete this exercise __________ times a day. Forearm rotation, supination This exercise is sometimes called palm-up stretch. Sit with your left / right forearm supported on a table. Your elbow should be at waist height. Gently grasp a lightweight hammer. Rest your hand over the edge of the table, palm down. Without moving your left / right elbow, slowly rotate your palm up (supination), stopping when your thumb is pointed toward the ceiling. Hold this position for__________ seconds. Slowly return to the starting position. Repeat __________ times. Complete this exercise __________ times a day. Forearm rotation, pronation This exercise is sometimes called palm-down stretch. Sit with your left / right forearm supported on a table. Your elbow should be at waist height. Gently grasp a lightweight hammer. Rest your hand over the edge of the table, palm up. Without moving your left / right elbow, slowly rotate your palm down (pronation), stopping when your thumb is pointed toward the floor. Hold this position for __________ seconds. Slowly return to the starting position. Repeat __________ times. Complete this exercise __________ times a day. This information is not intended to replace advice given to you  by your health care provider. Make sure you discuss any questions you have with your health care provider. Document Revised: 11/06/2020 Document Reviewed: 11/06/2020 Elsevier Patient Education  2022 [employer].

## 2021-09-14 NOTE — Progress Notes (Signed)
BP 122/76 (BP Location: Right Arm, Patient Position: Sitting, Cuff Size: Normal) Comment: Patient refused   Pulse 80    Temp 98.4 F (36.9 C)    Resp 18    Wt 155 lb 3.2 oz (70.4 kg)    SpO2 98%    BMI 26.64 kg/m    Subjective:    Patient ID: Milbert Coulter, female    DOB: 25-Jun-1956, 65 y.o.   MRN: [id number]  HPI: SAACHI ZALE is a 65 y.o. female  Chief Complaint  Patient presents with   Tick Removal    Patient states she removed the tick off right inner thigh 09/02/21 and states she had some redness and itchiness. Patient states she has some joint pain following the tick bite. Patient states it got sore around the area. Patient states she some of the symptoms has subsided.   Muscle Pain    Patient states she notices bulging out in her right arm. Patient states she was working with her horse and he yanked away from her arm and she heard a tear in the area on 06/11/21. Patient states the first few days she had soreness and she tried the cold compress to the area. Patient states she has concerns that she would like to address with provider.    TICK BITE On 09/02/21 she removed tick and then 09/05/21  noted a rash and started to have muscle aches.   Duration: 09/02/21 Location: right inner thigh Onset: 09/02/21 Itching: yes Status: stable Treatments attempted: tea tree oil Fever: no Chills: no headache: no Muscle pain: yes -- improving Rash: yes -- improving  ARM ISSUES She reports hearing tear to right bicep area on 06/11/21 -- she heard what sounded like rip at time.  No discoloration to area after the incident, pain from previous injury to that arm went away after that incident.  Since then rested it, but has started lifting 2 pounds weights.  Has been avoiding using her arm like she used to. Duration: months Location: right Mechanism of injury:  pull from horse Onset: sudden Status: no pain Treatments attempted: rest and physical therapy at home  Relief with NSAIDs?:  No  NSAIDs Taken Swelling: no Redness: no  Warmth: no Trauma: no Chest pain: no  Shortness of breath: no  Fever: no Decreased sensation: no Paresthesias: no Weakness: no   Relevant past medical, surgical, family and social history reviewed and updated as indicated. Interim medical history since our last visit reviewed. Allergies and medications reviewed and updated.  Review of Systems  Constitutional:  Negative for activity change, appetite change, diaphoresis, fatigue and fever.  Respiratory:  Negative for cough, chest tightness and shortness of breath.   Cardiovascular:  Negative for chest pain, palpitations and leg swelling.  Gastrointestinal: Negative.   Musculoskeletal:  Positive for arthralgias.  Skin:  Positive for rash.  Neurological: Negative.   Psychiatric/Behavioral: Negative.     Per HPI unless specifically indicated above     Objective:    BP 122/76 (BP Location: Right Arm, Patient Position: Sitting, Cuff Size: Normal) Comment: Patient refused   Pulse 80    Temp 98.4 F (36.9 C)    Resp 18    Wt 155 lb 3.2 oz (70.4 kg)    SpO2 98%    BMI 26.64 kg/m   Wt Readings from Last 3 Encounters:  09/14/21 155 lb 3.2 oz (70.4 kg)  06/10/21 148 lb (67.1 kg)  05/08/21 151 lb (68.5 kg)    Physical  Exam Vitals and nursing note reviewed.  Constitutional:      General: She is awake. She is not in acute distress.    Appearance: She is well-developed and well-groomed. She is not ill-appearing or toxic-appearing.  HENT:     Head: Normocephalic.     Right Ear: Hearing normal.     Left Ear: Hearing normal.     Nose: Nose normal.  Eyes:     General: Lids are normal.        Right eye: No discharge.        Left eye: No discharge.     Conjunctiva/sclera: Conjunctivae normal.     Pupils: Pupils are equal, round, and reactive to light.  Neck:     Thyroid: No thyromegaly.     Vascular: No carotid bruit.  Cardiovascular:     Rate and Rhythm: Normal rate and regular rhythm.      Heart sounds: Normal heart sounds. No murmur heard.   No gallop.  Pulmonary:     Effort: Pulmonary effort is normal. No accessory muscle usage or respiratory distress.     Breath sounds: Normal breath sounds.  Abdominal:     General: Bowel sounds are normal.     Palpations: Abdomen is soft.  Musculoskeletal:     Right upper arm: Deformity (right bicep with protrusion away from upper aspect arm) present. No swelling, tenderness or bony tenderness.     Left upper arm: Normal.     Cervical back: Normal range of motion and neck supple.     Right lower leg: No edema.     Left lower leg: No edema.  Skin:    General: Skin is warm and dry.     Findings: No rash.       Neurological:     Mental Status: She is alert and oriented to person, place, and time.  Psychiatric:        Attention and Perception: Attention normal.        Mood and Affect: Mood normal.        Speech: Speech normal.        Behavior: Behavior normal. Behavior is cooperative.        Thought Content: Thought content normal.    Results for orders placed or performed in visit on 06/10/21  Microalbumin, Urine Waived  Result Value Ref Range   Microalb, Ur Waived 80 (H) 0 - 19 mg/L   Creatinine, Urine Waived 200 10 - 300 mg/dL   Microalb/Creat Ratio 30-300 (H) <30 mg/g  CBC with Differential/Platelet  Result Value Ref Range   WBC 4.8 3.4 - 10.8 x10E3/uL   RBC 4.53 3.77 - 5.28 x10E6/uL   Hemoglobin 14.8 11.1 - 15.9 g/dL   Hematocrit 42.9 34.0 - 46.6 %   MCV 95 79 - 97 fL   MCH 32.7 26.6 - 33.0 pg   MCHC 34.5 31.5 - 35.7 g/dL   RDW 12.5 11.7 - 15.4 %   Platelets 223 150 - 450 x10E3/uL   Neutrophils 57 Not Estab. %   Lymphs 26 Not Estab. %   Monocytes 13 Not Estab. %   Eos 3 Not Estab. %   Basos 1 Not Estab. %   Neutrophils Absolute 2.8 1.4 - 7.0 x10E3/uL   Lymphocytes Absolute 1.2 0.7 - 3.1 x10E3/uL   Monocytes Absolute 0.6 0.1 - 0.9 x10E3/uL   EOS (ABSOLUTE) 0.1 0.0 - 0.4 x10E3/uL   Basophils Absolute 0.0 0.0 -  0.2 x10E3/uL   Immature Granulocytes 0  Not Estab. %   Immature Grans (Abs) 0.0 0.0 - 0.1 x10E3/uL  Comprehensive metabolic panel  Result Value Ref Range   Glucose 95 65 - 99 mg/dL   BUN 14 8 - 27 mg/dL   Creatinine, Ser 0.84 0.57 - 1.00 mg/dL   eGFR 78 >59 mL/min/1.73   BUN/Creatinine Ratio 17 12 - 28   Sodium 142 134 - 144 mmol/L   Potassium 4.2 3.5 - 5.2 mmol/L   Chloride 100 96 - 106 mmol/L   CO2 26 20 - 29 mmol/L   Calcium 10.5 (H) 8.7 - 10.3 mg/dL   Total Protein 7.6 6.0 - 8.5 g/dL   Albumin 5.5 (H) 3.8 - 4.8 g/dL   Globulin, Total 2.1 1.5 - 4.5 g/dL   Albumin/Globulin Ratio 2.6 (H) 1.2 - 2.2   Bilirubin Total 0.7 0.0 - 1.2 mg/dL   Alkaline Phosphatase 72 44 - 121 IU/L   AST 27 0 - 40 IU/L   ALT 24 0 - 32 IU/L  Lipid Panel w/o Chol/HDL Ratio  Result Value Ref Range   Cholesterol, Total 273 (H) 100 - 199 mg/dL   Triglycerides 261 (H) 0 - 149 mg/dL   HDL 44 >39 mg/dL   VLDL Cholesterol Cal 50 (H) 5 - 40 mg/dL   LDL Chol Calc (NIH) 179 (H) 0 - 99 mg/dL  TSH  Result Value Ref Range   TSH 3.690 0.450 - 4.500 uIU/mL  C-reactive protein  Result Value Ref Range   CRP <1 0 - 10 mg/L  Sed Rate (ESR)  Result Value Ref Range   Sed Rate 2 0 - 40 mm/hr      Assessment & Plan:   Problem List Items Addressed This Visit       Musculoskeletal and Integument   Tick bite of right thigh    Acute and overall improving at this time with no further symptoms.  Will obtain tick borne disease labs today and treat as needed based on results.  Discussed with patient.      Relevant Orders   Rocky mtn spotted fvr abs pnl(IgG+IgM)   Ehrlichia antibody panel   Babesia microti Antibody Panel   Comprehensive metabolic panel   CBC with Differential/Platelet   Lyme disease, western blot     Other   Pain of right upper extremity - Primary    Happened in September to right bicep with no pain or bruising to area.  At this time is stable, but obvious deformity present.  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      Relevant Orders   Ambulatory referral to Orthopedics     Follow up plan: Return for as scheduled upcoming.

## 2021-09-15 ENCOUNTER — Encounter: Payer: Self-pay | Admitting: Nurse Practitioner

## 2021-09-16 NOTE — Progress Notes (Signed)
Contacted via MyChart   Good evening Christina Manning -- CBC and CMP show stable levels. Currently still waiting on some tick borne disease labs today returned.  Only two have returned so far and are negative.  Will let you know when rest return:) Keep being awesome!!  Thank you for allowing me to participate in your care.  I appreciate you. Kindest regards, Ricka Westra

## 2021-09-17 ENCOUNTER — Ambulatory Visit: Payer: Self-pay | Admitting: Dermatology

## 2021-09-17 LAB — COMPREHENSIVE METABOLIC PANEL
ALT: 20 IU/L (ref 0–32)
AST: 19 IU/L (ref 0–40)
Albumin/Globulin Ratio: 2.6 — ABNORMAL HIGH (ref 1.2–2.2)
Albumin: 5 g/dL — ABNORMAL HIGH (ref 3.8–4.8)
Alkaline Phosphatase: 65 IU/L (ref 44–121)
BUN/Creatinine Ratio: 24 (ref 12–28)
BUN: 22 mg/dL (ref 8–27)
Bilirubin Total: 0.3 mg/dL (ref 0.0–1.2)
CO2: 24 mmol/L (ref 20–29)
Calcium: 10.2 mg/dL (ref 8.7–10.3)
Chloride: 101 mmol/L (ref 96–106)
Creatinine, Ser: 0.91 mg/dL (ref 0.57–1.00)
Globulin, Total: 1.9 g/dL (ref 1.5–4.5)
Glucose: 90 mg/dL (ref 70–99)
Potassium: 3.9 mmol/L (ref 3.5–5.2)
Sodium: 142 mmol/L (ref 134–144)
Total Protein: 6.9 g/dL (ref 6.0–8.5)
eGFR: 70 mL/min/{1.73_m2} (ref >59)

## 2021-09-17 LAB — CBC WITH DIFFERENTIAL/PLATELET
Basophils Absolute: 0 10*3/uL (ref 0.0–0.2)
Basos: 1 %
EOS (ABSOLUTE): 0.1 10*3/uL (ref 0.0–0.4)
Eos: 3 %
Hematocrit: 39.3 % (ref 34.0–46.6)
Hemoglobin: 13.7 g/dL (ref 11.1–15.9)
Immature Grans (Abs): 0 10*3/uL (ref 0.0–0.1)
Immature Granulocytes: 0 %
Lymphocytes Absolute: 1.2 10*3/uL (ref 0.7–3.1)
Lymphs: 24 %
MCH: 32.2 pg (ref 26.6–33.0)
MCHC: 34.9 g/dL (ref 31.5–35.7)
MCV: 93 fL (ref 79–97)
Monocytes Absolute: 0.7 10*3/uL (ref 0.1–0.9)
Monocytes: 14 %
Neutrophils Absolute: 3.1 10*3/uL (ref 1.4–7.0)
Neutrophils: 58 %
Platelets: 232 10*3/uL (ref 150–450)
RBC: 4.25 x10E6/uL (ref 3.77–5.28)
RDW: 11.6 % — ABNORMAL LOW (ref 11.7–15.4)
WBC: 5.2 10*3/uL (ref 3.4–10.8)

## 2021-09-17 LAB — LYME DISEASE, WESTERN BLOT
IgG P18 Ab.: ABSENT
IgG P23 Ab.: ABSENT
IgG P28 Ab.: ABSENT
IgG P30 Ab.: ABSENT
IgG P39 Ab.: ABSENT
IgG P41 Ab.: ABSENT
IgG P45 Ab.: ABSENT
IgG P58 Ab.: ABSENT
IgG P66 Ab.: ABSENT
IgG P93 Ab.: ABSENT
IgM P23 Ab.: ABSENT
IgM P39 Ab.: ABSENT
IgM P41 Ab.: ABSENT
Lyme IgG Wb: NEGATIVE
Lyme IgM Wb: NEGATIVE

## 2021-09-17 LAB — EHRLICHIA ANTIBODY PANEL
E. Chaffeensis (HME) IgM Titer: NEGATIVE
E.Chaffeensis (HME) IgG: NEGATIVE
HGE IgG Titer: NEGATIVE
HGE IgM Titer: NEGATIVE

## 2021-09-17 LAB — ROCKY MTN SPOTTED FVR ABS PNL(IGG+IGM)
RMSF IgG: NEGATIVE
RMSF IgM: 0.37 index (ref 0.00–0.89)

## 2021-09-17 LAB — BABESIA MICROTI ANTIBODY PANEL
Babesia microti IgG: <1:10 {titer}
Babesia microti IgM: <1:10 {titer}

## 2021-09-17 NOTE — Progress Notes (Signed)
Contacted via MyChart   Good morning Meili, remainder of tick borne disease labs returned negative:)

## 2021-09-22 ENCOUNTER — Telehealth: Payer: Self-pay | Admitting: Nurse Practitioner

## 2021-09-22 NOTE — Telephone Encounter (Signed)
Copied from [place] [phone]. Topic: Referral - Status >> Sep 22, 2021  1:15 PM Erick Blinks wrote: Reason for CRM: Pt wants to speak to referrals, wants status update Best contact:  [phone] >> Sep 22, 2021  1:52 PM Street, Berenda Morale wrote: Pt wants referral sent to [facility] and not [facility]

## 2021-09-29 DIAGNOSIS — M25511 Pain in right shoulder: Secondary | ICD-10-CM | POA: Diagnosis not present

## 2021-10-01 DIAGNOSIS — M6281 Muscle weakness (generalized): Secondary | ICD-10-CM | POA: Diagnosis not present

## 2021-10-01 DIAGNOSIS — M66321 Spontaneous rupture of flexor tendons, right upper arm: Secondary | ICD-10-CM | POA: Diagnosis not present

## 2021-10-07 DIAGNOSIS — M66321 Spontaneous rupture of flexor tendons, right upper arm: Secondary | ICD-10-CM | POA: Diagnosis not present

## 2021-10-07 DIAGNOSIS — M6281 Muscle weakness (generalized): Secondary | ICD-10-CM | POA: Diagnosis not present

## 2021-10-16 DIAGNOSIS — M6281 Muscle weakness (generalized): Secondary | ICD-10-CM | POA: Diagnosis not present

## 2021-10-16 DIAGNOSIS — M66321 Spontaneous rupture of flexor tendons, right upper arm: Secondary | ICD-10-CM | POA: Diagnosis not present

## 2021-10-20 ENCOUNTER — Other Ambulatory Visit: Payer: Self-pay

## 2021-10-20 ENCOUNTER — Ambulatory Visit: Payer: Medicare Other | Admitting: Dermatology

## 2021-10-20 DIAGNOSIS — D2261 Melanocytic nevi of right upper limb, including shoulder: Secondary | ICD-10-CM | POA: Diagnosis not present

## 2021-10-20 DIAGNOSIS — B078 Other viral warts: Secondary | ICD-10-CM | POA: Diagnosis not present

## 2021-10-20 DIAGNOSIS — D229 Melanocytic nevi, unspecified: Secondary | ICD-10-CM

## 2021-10-20 DIAGNOSIS — L570 Actinic keratosis: Secondary | ICD-10-CM

## 2021-10-20 DIAGNOSIS — D2372 Other benign neoplasm of skin of left lower limb, including hip: Secondary | ICD-10-CM

## 2021-10-20 DIAGNOSIS — L821 Other seborrheic keratosis: Secondary | ICD-10-CM | POA: Diagnosis not present

## 2021-10-20 DIAGNOSIS — L578 Other skin changes due to chronic exposure to nonionizing radiation: Secondary | ICD-10-CM

## 2021-10-20 DIAGNOSIS — L308 Other specified dermatitis: Secondary | ICD-10-CM

## 2021-10-20 DIAGNOSIS — Z1283 Encounter for screening for malignant neoplasm of skin: Secondary | ICD-10-CM

## 2021-10-20 DIAGNOSIS — L814 Other melanin hyperpigmentation: Secondary | ICD-10-CM | POA: Diagnosis not present

## 2021-10-20 DIAGNOSIS — D1800 Hemangioma unspecified site: Secondary | ICD-10-CM | POA: Diagnosis not present

## 2021-10-20 DIAGNOSIS — D492 Neoplasm of unspecified behavior of bone, soft tissue, and skin: Secondary | ICD-10-CM

## 2021-10-20 HISTORY — DX: Actinic keratosis: L57.0

## 2021-10-20 MED ORDER — TRIAMCINOLONE ACETONIDE 0.1 % EX OINT
TOPICAL_OINTMENT | CUTANEOUS | 3 refills | Status: DC
Start: 2021-10-20 — End: 2022-06-12

## 2021-10-20 NOTE — Progress Notes (Signed)
New Patient Visit  Subjective  Christina Manning is a 66 y.o. female who presents for the following: Annual Exam (Mole check). Pt report hx of seborrheic keratosis removed by LN2 at her previous dermatologist office.  The patient presents for Total-Body Skin Exam (TBSE) for skin cancer screening and mole check.  The patient has spots, moles and lesions to be evaluated, some may be new or changing and the patient has concerns that these could be cancer.    The following portions of the chart were reviewed this encounter and updated as appropriate:   Tobacco   Allergies   Meds   Problems   Med Hx   Surg Hx   Fam Hx       Review of Systems:  No other skin or systemic complaints except as noted in HPI or Assessment and Plan.  Objective  Well appearing patient in no apparent distress; mood and affect are within normal limits.  A full examination was performed including scalp, head, eyes, ears, nose, lips, neck, chest, axillae, abdomen, back, buttocks, bilateral upper extremities, bilateral lower extremities, hands, feet, fingers, toes, fingernails, and toenails. All findings within normal limits unless otherwise noted below.  left elbow Verrucous papules -- Discussed viral etiology and contagion.   Right Forearm - Anterior 0.3 cm dark brown thin papule without features suspicious for malignancy on dermoscopy        upper back Scaly erythematous papules and patches +/- dyspigmentation, lichenification, excoriations.   Left lateral calf 0.7 cm dark pink macule          Assessment & Plan  Other viral warts left elbow  Discussed viral etiology and risk of spread.  Discussed multiple treatments may be required to clear warts.  Discussed possible post-treatment dyspigmentation and risk of recurrence.   Prior to procedure, discussed risks of blister formation, small wound, skin dyspigmentation, or rare scar following cryotherapy. Recommend Vaseline ointment to treated areas  while healing.  Cantharidin Plus is a blistering agent that comes from a beetle.  It needs to be washed off in about 4 hours after application.  Although it is painless when applied in office, it may cause symptoms of mild pain and burning several hours later.  Treated areas will swell and turn red, and blisters may form.  Vaseline and a bandaid may be applied until wound has healed.  Once healed, the skin may remain temporarily discolored.  It can take weeks to months for pigmentation to return to normal.  Advised to wash off with soap and water in 4 hours or sooner if it becomes tender before then.  Squaric Acid 3% applied to warts today. Prior to application reviewed risk of inflammation and irritation.     Destruction of lesion - left elbow Complexity: simple   Destruction method: cryotherapy   Informed consent: discussed and consent obtained   Timeout:  patient name, date of birth, surgical site, and procedure verified Lesion destroyed using liquid nitrogen: Yes   Region frozen until ice ball extended beyond lesion: Yes   Outcome: patient tolerated procedure well with no complications   Post-procedure details: wound care instructions given    Destruction of lesion - left elbow  Destruction method: chemical removal   Destruction method comment:  Squaric 3% acid Informed consent: discussed and consent obtained   Timeout:  patient name, date of birth, surgical site, and procedure verified Chemical destruction method: cantharidin   Procedure instructions: patient instructed to wash and dry area   Outcome:  patient tolerated procedure well with no complications   Post-procedure details: wound care instructions given   Additional details:  Patient advised to set alarm to remind them to wash off with soap and water at the directed time.  Nevus Right Forearm - Anterior  Benign-appearing.  Observation.  Call clinic for new or changing moles.  Recommend daily use of broad spectrum spf 30+  sunscreen to sun-exposed areas.    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 Condition is bothersome to patient. Currently flared.  Eczema is a chronic, relapsing, pruritic condition that can significantly affect quality of life. It is often associated with allergic rhinitis and/or asthma and can require treatment with topical medications, phototherapy, or in severe cases biologic injectable medication (Dupixent; Adbry) or Oral JAK inhibitors.   May consider patch testing in the future   Start triamcinolone 0.1% ointment apply to skin twice a day for up to 2 weeks then stop   Topical steroids (such as triamcinolone, fluocinolone, fluocinonide, mometasone, clobetasol, halobetasol, betamethasone, hydrocortisone) can cause thinning and lightening of the skin if they are used for too long in the same area. Your physician has selected the right strength medicine for your problem and area affected on the body. Please use your medication only as directed by your physician to prevent side effects.    Recommend gentle skin care.  Related Medications triamcinolone ointment (KENALOG) 0.1 % Apply to affected skin twice a day for 2 weeks then stop  Neoplasm of skin Left lateral calf  Skin / nail biopsy Type of biopsy: tangential   Informed consent: discussed and consent obtained   Patient was prepped and draped in usual sterile fashion: area prepped with alochol. Anesthesia: the lesion was anesthetized in a standard fashion   Anesthetic:  1% lidocaine w/ epinephrine 1-100,000 buffered w/ 8.4% NaHCO3 Instrument used: flexible razor blade   Hemostasis achieved with: pressure, aluminum chloride and electrodesiccation   Outcome: patient tolerated procedure well   Post-procedure details: wound care instructions given   Post-procedure details comment:  Ointment and small bandage  Specimen 1 - Surgical pathology Differential Diagnosis: R/O Lichenoid  keratosis vs BCC over Amelanotic    Check Margins: No  R/O Lichenoid keratosis vs BCC over Amelanotic    Lentigines - Scattered tan macules - Due to sun exposure - Benign-appearing, observe - Recommend daily broad spectrum sunscreen SPF 30+ to sun-exposed areas, reapply every 2 hours as needed. - Call for any changes  Seborrheic Keratoses - Stuck-on, waxy, tan-brown papules and/or plaques  - Benign-appearing - Discussed benign etiology and prognosis. - Observe - Call for any changes  Melanocytic Nevi - Tan-brown and/or pink-flesh-colored symmetric macules and papules - Benign appearing on exam today - Observation - Call clinic for new or changing moles - Recommend daily use of broad spectrum spf 30+ sunscreen to sun-exposed areas.   Hemangiomas - Red papules - Discussed benign nature - Observe - Call for any changes  Actinic Damage - Chronic condition, secondary to cumulative UV/sun exposure - diffuse scaly erythematous macules with underlying dyspigmentation - Recommend daily broad spectrum sunscreen SPF 30+ to sun-exposed areas, reapply every 2 hours as needed.  - Staying in the shade or wearing long sleeves, sun glasses (UVA+UVB protection) and wide brim hats (4-inch brim around the entire circumference of the hat) are also recommended for sun protection.  - Call for new or changing lesions.  Dermatofibroma Left pretibial  - Firm pink/brown papulenodule with dimple  sign - Benign appearing - Call for any changes   Skin cancer screening performed today.   Return in about 4 weeks (around 11/17/2021), or wart.   I, Marye Round, CMA, am acting as scribe for Forest Gleason, MD .   Documentation: I have reviewed the above documentation for accuracy and completeness, and I agree with the above.  Forest Gleason, MD

## 2021-10-20 NOTE — Patient Instructions (Addendum)
Wound Care Instructions  Cleanse wound gently with soap and water once a day then pat dry with clean gauze. Apply a thing coat of Petrolatum (petroleum jelly, "Vaseline") over the wound (unless you have an allergy to this). We recommend that you use a new, sterile tube of Vaseline. Do not pick or remove scabs. Do not remove the yellow or white "healing tissue" from the base of the wound.  Cover the wound with fresh, clean, nonstick gauze and secure with paper tape. You may use Band-Aids in place of gauze and tape if the would is small enough, but would recommend trimming much of the tape off as there is often too much. Sometimes Band-Aids can irritate the skin.  You should call the office for your biopsy report after 1 week if you have not already been contacted.  If you experience any problems, such as abnormal amounts of bleeding, swelling, significant bruising, significant pain, or evidence of infection, please call the office immediately.  FOR ADULT SURGERY PATIENTS: If you need something for pain relief you may take 1 extra strength Tylenol (acetaminophen) AND 2 Ibuprofen (200mg  each) together every 4 hours as needed for pain. (do not take these if you are allergic to them or if you have a reason you should not take them.) Typically, you may only need pain medication for 1 to 3 days.        Recommend taking Heliocare sun protection supplement daily in sunny weather for additional sun protection. For maximum protection on the sunniest days, you can take up to 2 capsules of regular Heliocare OR take 1 capsule of Heliocare Ultra. For prolonged exposure (such as a full day in the sun), you can repeat your dose of the supplement 4 hours after your first dose. Heliocare can be purchased at [employer], at some [facility] or at [email or web address].    Recommend daily broad spectrum sunscreen SPF 30+ to sun-exposed areas, reapply every 2 hours as needed. Call for new or changing lesions.   Staying in the shade or wearing long sleeves, sun glasses (UVA+UVB protection) and wide brim hats (4-inch brim around the entire circumference of the hat) are also recommended for sun protection.    Cryotherapy Aftercare  Wash gently with soap and water everyday.   Apply Vaseline and Band-Aid daily until healed.   Instructions for After In-Office Application of Cantharidin  1. This is a strong medicine; please follow ALL instructions.  2. Gently wash off with soap and water in four hours or sooner s directed by your physician.  3. **WARNING** this medicine can cause severe blistering, blood blisters, infection, and/or scarring if it is not washed off as directed.  4. Your progress will be rechecked in 1-2 months; call sooner if there are any questions or problems.    If You Need Anything After Your Visit  If you have any questions or concerns for your doctor, please call our main line at [phone] and press option 4 to reach your doctor's medical assistant. If no one answers, please leave a voicemail as directed and we will return your call as soon as possible. Messages left after 4 pm will be answered the following business day.   You may also send [place] a message via [place]. We typically respond to MyChart messages within 1-2 business days.  For prescription refills, please ask your pharmacy to contact our office. Our fax number is [phone].  If you have an urgent issue when the clinic is closed that cannot  wait until the next business day, you can page your doctor at the number below.    Please note that while we do our best to be available for urgent issues outside of office hours, we are not available 24/7.   If you have an urgent issue and are unable to reach [place], you may choose to seek medical care at your doctor's office, retail clinic, urgent care center, or emergency room.  If you have a medical emergency, please immediately call 911 or go to the emergency  department.  Pager Numbers  - Dr. Nehemiah Massed: [phone]  - Dr. Laurence Ferrari: [phone]  - Dr. Nicole Kindred: [phone]  In the event of inclement weather, please call our main line at [phone] for an update on the status of any delays or closures.  Dermatology Medication Tips: Please keep the boxes that topical medications come in in order to help keep track of the instructions about where and how to use these. Pharmacies typically print the medication instructions only on the boxes and not directly on the medication tubes.   If your medication is too expensive, please contact our office at [phone] option 4 or send [place] a message through [place].   We are unable to tell what your co-pay for medications will be in advance as this is different depending on your insurance coverage. However, we may be able to find a substitute medication at lower cost or fill out paperwork to get insurance to cover a needed medication.   If a prior authorization is required to get your medication covered by your insurance company, please allow [place] 1-2 business days to complete this process.  Drug prices often vary depending on where the prescription is filled and some pharmacies may offer cheaper prices.  The website www.goodrx.com contains coupons for medications through different pharmacies. The prices here do not account for what the cost may be with help from insurance (it may be cheaper with your insurance), but the website can give you the price if you did not use any insurance.  - You can print the associated coupon and take it with your prescription to the pharmacy.  - You may also stop by our office during regular business hours and pick up a GoodRx coupon card.  - If you need your prescription sent electronically to a different pharmacy, notify our office through [facility] or by phone at [phone] option 4.     Si Usted Necesita Algo Despus de Su Visita  Tambin puede enviarnos un  mensaje a travs de Pharmacist, community. Por lo general respondemos a los mensajes de MyChart en el transcurso de 1 a 2 das hbiles.  Para renovar recetas, por favor pida a su farmacia que se ponga en contacto con nuestra oficina. Harland Dingwall de fax es [place] [phone].  Si tiene un asunto urgente cuando la clnica est cerrada y que no puede esperar hasta el siguiente da hbil, puede llamar/localizar a su doctor(a) al nmero que aparece a continuacin.   Por favor, tenga en cuenta que aunque hacemos todo lo posible para estar disponibles para asuntos urgentes fuera del horario de [place], no estamos disponibles las 24 horas del da, los 7 das de la [place].   Si tiene un problema urgente y no puede comunicarse con nosotros, puede optar por buscar atencin mdica  en el consultorio de su doctor(a), en una clnica privada, en un centro de atencin urgente o en una sala de emergencias.  Si tiene [employer], por favor llame  inmediatamente al 911 o vaya a la sala de Multimedia programmer.  Nmeros de bper  - Dr. Nehemiah Massed: [phone]  - Dra. Moye: [phone]  - Dra. Nicole Kindred: [phone]  En caso de inclemencias del [place], por favor llame a Johnsie Kindred principal al [phone] para una actualizacin sobre el [place] de cualquier retraso o cierre.  Consejos para la medicacin en dermatologa: Por favor, guarde las cajas en las que vienen los medicamentos de uso tpico para ayudarle a seguir las instrucciones sobre dnde y cmo usarlos. Las farmacias generalmente imprimen las instrucciones del medicamento slo en las cajas y no directamente en los tubos del [place].   Si su medicamento es muy caro, por favor, pngase en contacto con Zigmund Daniel llamando al [phone] y presione la opcin 4 o envenos un mensaje a travs de Pharmacist, community.   No podemos decirle cul ser su copago por los medicamentos por adelantado ya que esto es diferente dependiendo de la cobertura de su seguro. Sin embargo,  es posible que podamos encontrar un medicamento sustituto a Electrical engineer un formulario para que el seguro cubra el medicamento que se considera necesario.   Si se requiere una autorizacin previa para que su compaa de seguros [place] su medicamento, por favor permtanos de 1 a 2 das hbiles para completar este proceso.  Los precios de los medicamentos varan con frecuencia dependiendo del Environmental consultant de dnde se surte la receta y alguna farmacias pueden ofrecer precios ms baratos.  El sitio web www.goodrx.com tiene cupones para medicamentos de Airline pilot. Los precios aqu no tienen en cuenta lo que podra costar con la ayuda del seguro (puede ser ms barato con su seguro), pero el sitio web puede darle el precio si no utiliz Research scientist (physical sciences).  - Puede imprimir el cupn correspondiente y llevarlo con su receta a la farmacia.  - Tambin puede pasar por nuestra oficina durante el horario de atencin regular y Charity fundraiser una tarjeta de cupones de GoodRx.  - Si necesita que su receta se enve electrnicamente a una farmacia diferente, informe a nuestra oficina a travs de MyChart de [place] o por telfono llamando al [phone] y presione la opcin 4.

## 2021-10-21 DIAGNOSIS — M6281 Muscle weakness (generalized): Secondary | ICD-10-CM | POA: Diagnosis not present

## 2021-10-21 DIAGNOSIS — M66321 Spontaneous rupture of flexor tendons, right upper arm: Secondary | ICD-10-CM | POA: Diagnosis not present

## 2021-10-22 ENCOUNTER — Telehealth: Payer: Self-pay

## 2021-10-22 NOTE — Telephone Encounter (Signed)
Left pt msg to call for bx result/sh °

## 2021-10-22 NOTE — Telephone Encounter (Signed)
-----   Message from Alfonso Patten, MD sent at 10/21/2021  5:59 PM EST ----- Skin , left lateral calf ACTINIC KERATOSIS WITH FEATURES OF A VERRUCA, INFLAMED --> LN2 in clinic at her f/u  MAs please call. Thank you!

## 2021-10-23 ENCOUNTER — Encounter: Payer: Self-pay | Admitting: Dermatology

## 2021-10-26 ENCOUNTER — Telehealth: Payer: Self-pay

## 2021-10-26 NOTE — Telephone Encounter (Signed)
-----   Message from Alfonso Patten, MD sent at 10/21/2021  5:59 PM EST ----- Skin , left lateral calf ACTINIC KERATOSIS WITH FEATURES OF A VERRUCA, INFLAMED --> LN2 in clinic at her f/u  MAs please call. Thank you!

## 2021-10-26 NOTE — Telephone Encounter (Signed)
Patient advised of BX results and will be here for February appointment. aw

## 2021-10-28 DIAGNOSIS — M66321 Spontaneous rupture of flexor tendons, right upper arm: Secondary | ICD-10-CM | POA: Diagnosis not present

## 2021-10-28 DIAGNOSIS — M6281 Muscle weakness (generalized): Secondary | ICD-10-CM | POA: Diagnosis not present

## 2021-11-04 DIAGNOSIS — M6281 Muscle weakness (generalized): Secondary | ICD-10-CM | POA: Diagnosis not present

## 2021-11-04 DIAGNOSIS — M66321 Spontaneous rupture of flexor tendons, right upper arm: Secondary | ICD-10-CM | POA: Diagnosis not present

## 2021-11-17 ENCOUNTER — Encounter: Payer: Self-pay | Admitting: Dermatology

## 2021-11-17 ENCOUNTER — Ambulatory Visit: Payer: Medicare Other | Admitting: Dermatology

## 2021-11-17 ENCOUNTER — Other Ambulatory Visit: Payer: Self-pay

## 2021-11-17 DIAGNOSIS — L570 Actinic keratosis: Secondary | ICD-10-CM | POA: Diagnosis not present

## 2021-11-17 DIAGNOSIS — Z8619 Personal history of other infectious and parasitic diseases: Secondary | ICD-10-CM

## 2021-11-17 NOTE — Patient Instructions (Addendum)
Cryotherapy Aftercare  Wash gently with soap and water everyday.   Apply Vaseline and Band-Aid daily until healed.   Prior to procedure, discussed risks of blister formation, small wound, skin dyspigmentation, or rare scar following cryotherapy. Recommend Vaseline ointment to treated areas while healing.      If You Need Anything After Your Visit  If you have any questions or concerns for your doctor, please call our main line at [phone] and press option 4 to reach your doctor's medical assistant. If no one answers, please leave a voicemail as directed and we will return your call as soon as possible. Messages left after 4 pm will be answered the following business day.   You may also send [place] a message via [place]. We typically respond to MyChart messages within 1-2 business days.  For prescription refills, please ask your pharmacy to contact our office. Our fax number is [phone].  If you have an urgent issue when the clinic is closed that cannot wait until the next business day, you can page your doctor at the number below.    Please note that while we do our best to be available for urgent issues outside of office hours, we are not available 24/7.   If you have an urgent issue and are unable to reach [place], you may choose to seek medical care at your doctor's office, retail clinic, urgent care center, or emergency room.  If you have a medical emergency, please immediately call 911 or go to the emergency department.  Pager Numbers  - Dr. Nehemiah Massed: [phone]  - Dr. Laurence Ferrari: [phone]  - Dr. Nicole Kindred: [phone]  In the event of inclement weather, please call our main line at [phone] for an update on the status of any delays or closures.  Dermatology Medication Tips: Please keep the boxes that topical medications come in in order to help keep track of the instructions about where and how to use these. Pharmacies typically print the medication instructions only on  the boxes and not directly on the medication tubes.   If your medication is too expensive, please contact our office at [phone] option 4 or send [place] a message through [place].   We are unable to tell what your co-pay for medications will be in advance as this is different depending on your insurance coverage. However, we may be able to find a substitute medication at lower cost or fill out paperwork to get insurance to cover a needed medication.   If a prior authorization is required to get your medication covered by your insurance company, please allow [place] 1-2 business days to complete this process.  Drug prices often vary depending on where the prescription is filled and some pharmacies may offer cheaper prices.  The website www.goodrx.com contains coupons for medications through different pharmacies. The prices here do not account for what the cost may be with help from insurance (it may be cheaper with your insurance), but the website can give you the price if you did not use any insurance.  - You can print the associated coupon and take it with your prescription to the pharmacy.  - You may also stop by our office during regular business hours and pick up a GoodRx coupon card.  - If you need your prescription sent electronically to a different pharmacy, notify our office through [facility] or by phone at [phone] option 4.     Si Usted Necesita Algo Despus de Su Visita  Tambin puede enviarnos un  mensaje a travs de MyChart. Por lo general respondemos a los mensajes de MyChart en el transcurso de 1 a 2 das hbiles.  Para renovar recetas, por favor pida a su farmacia que se ponga en contacto con nuestra oficina. Harland Dingwall de fax es [place] [phone].  Si tiene un asunto urgente cuando la clnica est cerrada y que no puede esperar hasta el siguiente da hbil, puede llamar/localizar a su doctor(a) al nmero que aparece a continuacin.   Por favor, tenga en cuenta que  aunque hacemos todo lo posible para estar disponibles para asuntos urgentes fuera del horario de [place], no estamos disponibles las 24 horas del da, los 7 das de la [place].   Si tiene un problema urgente y no puede comunicarse con nosotros, puede optar por buscar atencin mdica  en el consultorio de su doctor(a), en una clnica privada, en un centro de atencin urgente o en una sala de emergencias.  Si tiene Engineering geologist, por favor llame inmediatamente al 911 o vaya a la sala de emergencias.  Nmeros de bper  - Dr. Nehemiah Massed: [phone]  - Dra. Moye: [phone]  - Dra. Nicole Kindred: [phone]  En caso de inclemencias del [place], por favor llame a Johnsie Kindred principal al [phone] para una actualizacin sobre el [place] de cualquier retraso o cierre.  Consejos para la medicacin en dermatologa: Por favor, guarde las cajas en las que vienen los medicamentos de uso tpico para ayudarle a seguir las instrucciones sobre dnde y cmo usarlos. Las farmacias generalmente imprimen las instrucciones del medicamento slo en las cajas y no directamente en los tubos del [place].   Si su medicamento es muy caro, por favor, pngase en contacto con Zigmund Daniel llamando al [phone] y presione la opcin 4 o envenos un mensaje a travs de Pharmacist, community.   No podemos decirle cul ser su copago por los medicamentos por adelantado ya que esto es diferente dependiendo de la cobertura de su seguro. Sin embargo, es posible que podamos encontrar un medicamento sustituto a Electrical engineer un formulario para que el seguro cubra el medicamento que se considera necesario.   Si se requiere una autorizacin previa para que su compaa de seguros [place] su medicamento, por favor permtanos de 1 a 2 das hbiles para completar este proceso.  Los precios de los medicamentos varan con frecuencia dependiendo del Environmental consultant de dnde se surte la receta y alguna farmacias pueden ofrecer precios ms  baratos.  El sitio web www.goodrx.com tiene cupones para medicamentos de Airline pilot. Los precios aqu no tienen en cuenta lo que podra costar con la ayuda del seguro (puede ser ms barato con su seguro), pero el sitio web puede darle el precio si no utiliz Research scientist (physical sciences).  - Puede imprimir el cupn correspondiente y llevarlo con su receta a la farmacia.  - Tambin puede pasar por nuestra oficina durante el horario de atencin regular y Charity fundraiser una tarjeta de cupones de GoodRx.  - Si necesita que su receta se enve electrnicamente a una farmacia diferente, informe a nuestra oficina a travs de MyChart de [place] o por telfono llamando al [phone] y presione la opcin 4.

## 2021-11-17 NOTE — Progress Notes (Signed)
° °  Follow-Up Visit   Subjective  Christina Manning is a 66 y.o. female who presents for the following: Actinic Keratosis (Left lateral calf. Bx proven AK with VV features. Here for recheck and probable LN2 Treatment).  The following portions of the chart were reviewed this encounter and updated as appropriate:  Tobacco   Allergies   Meds   Problems   Med Hx   Surg Hx   Fam Hx       Review of Systems: No other skin or systemic complaints except as noted in HPI or Assessment and Plan.   Objective  Well appearing patient in no apparent distress; mood and affect are within normal limits.  A focused examination was performed including face, left elbow, left calf. Relevant physical exam findings are noted in the Assessment and Plan.  Left Elbow - Posterior Pink smooth patch with no evidence of recurrence  left calf x1 Pink patch   Assessment & Plan  History of verruca vulgaris Left Elbow - Posterior  Observe. RTC PRN recurrence.   AK (actinic keratosis) left calf x1  Bx proven AK with VV features  Actinic keratoses are precancerous spots that appear secondary to cumulative UV radiation exposure/sun exposure over time. They are chronic with expected duration over 1 year. A portion of actinic keratoses will progress to squamous cell carcinoma of the skin. It is not possible to reliably predict which spots will progress to skin cancer and so treatment is recommended to prevent development of skin cancer.  Recommend daily broad spectrum sunscreen SPF 30+ to sun-exposed areas, reapply every 2 hours as needed.  Recommend staying in the shade or wearing long sleeves, sun glasses (UVA+UVB protection) and wide brim hats (4-inch brim around the entire circumference of the hat). Call for new or changing lesions.  Prior to procedure, discussed risks of blister formation, small wound, skin dyspigmentation, or rare scar following cryotherapy. Recommend Vaseline ointment to treated areas while  healing.   Destruction of lesion - left calf x1  Destruction method: cryotherapy   Informed consent: discussed and consent obtained   Lesion destroyed using liquid nitrogen: Yes   Outcome: patient tolerated procedure well with no complications   Post-procedure details: wound care instructions given     Return for TBSE As Scheduled.  I, Emelia Salisbury, CMA, am acting as scribe for Forest Gleason, MD.  Documentation: I have reviewed the above documentation for accuracy and completeness, and I agree with the above.  Forest Gleason, MD

## 2022-06-12 NOTE — Patient Instructions (Signed)

## 2022-06-14 ENCOUNTER — Ambulatory Visit (INDEPENDENT_AMBULATORY_CARE_PROVIDER_SITE_OTHER): Payer: Medicare Other | Admitting: Nurse Practitioner

## 2022-06-14 ENCOUNTER — Encounter: Payer: Self-pay | Admitting: Nurse Practitioner

## 2022-06-14 VITALS — BP 130/86 | HR 84 | Temp 98.0°F | Resp 20 | Ht 65.0 in | Wt 147.5 lb

## 2022-06-14 DIAGNOSIS — Z1231 Encounter for screening mammogram for malignant neoplasm of breast: Secondary | ICD-10-CM

## 2022-06-14 DIAGNOSIS — R030 Elevated blood-pressure reading, without diagnosis of hypertension: Secondary | ICD-10-CM | POA: Diagnosis not present

## 2022-06-14 DIAGNOSIS — E7800 Pure hypercholesterolemia, unspecified: Secondary | ICD-10-CM

## 2022-06-14 DIAGNOSIS — Z23 Encounter for immunization: Secondary | ICD-10-CM | POA: Diagnosis not present

## 2022-06-14 DIAGNOSIS — M85832 Other specified disorders of bone density and structure, left forearm: Secondary | ICD-10-CM

## 2022-06-14 DIAGNOSIS — Z136 Encounter for screening for cardiovascular disorders: Secondary | ICD-10-CM | POA: Diagnosis not present

## 2022-06-14 DIAGNOSIS — Z0000 Encounter for general adult medical examination without abnormal findings: Secondary | ICD-10-CM

## 2022-06-14 MED ORDER — SHINGRIX 50 MCG/0.5ML IM SUSR: 0.5000 mL | Freq: Once | INTRAMUSCULAR | 0 refills | Status: AC

## 2022-06-14 NOTE — Assessment & Plan Note (Signed)
Noted on past labs from 2018, recommend diet focus at home and regular activity.  Lipid panel today. ASCVD 7.4%.

## 2022-06-14 NOTE — Assessment & Plan Note (Signed)
Noted on DEXA imaging, she wishes to repeat this year if possible -- discussed this may not be covered but will ordered.  Continue Vitamin D3 and calcium rich diet at home.

## 2022-06-14 NOTE — Assessment & Plan Note (Signed)
Chronic, ongoing issue for years, she checks BP at home and is often <130/80.  Stable on check today.  Recommend she continue to monitor at home and focus on DASH diet.  LABS today: CBC, CMP, TSH, lipid.  Initiate medication as needed.

## 2022-06-14 NOTE — Progress Notes (Signed)
BP 130/86 (BP Location: Left Arm, Patient Position: Sitting, Cuff Size: Normal)   Pulse 84   Temp 98 F (36.7 C) (Oral)   Resp 20   Ht $R'5\' 5"'Gf$  (1.651 m)   Wt 147 lb 8 oz (66.9 kg)   SpO2 98%   BMI 24.55 kg/m    Subjective:    Patient ID: Christina Manning, female    DOB: 07-Aug-1956, 66 y.o.   MRN: [id number]  HPI: Christina Manning is a 66 y.o. female presenting on 06/14/2022 for comprehensive medical examination. 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  She gets mouth sores at times with stress, apricots, and if someone around her is ill -- recently went through mouth sores and this is improving.  Feels run down afterwards.  BP checks at home average = 98/58 to 150/73 -- average 115-125/65-72  The 10-year ASCVD risk score (Arnett DK, et al., 2019) is: 7.4%   Values used to calculate the score:     Age: 89 years     Sex: Female     Is Non-Hispanic African American: No     Diabetic: No     Tobacco smoker: No     Systolic Blood Pressure: 947 mmHg     Is BP treated: No     HDL Cholesterol: 44 mg/dL     Total Cholesterol: 273 mg/dL  Depression Screen done today and results listed below:     06/14/2022   10:38 AM 09/14/2021    2:31 PM 06/10/2021   10:40 AM 05/08/2021   11:11 AM  Depression screen PHQ 2/9  Decreased Interest 0 0 0 0  Down, Depressed, Hopeless 0 1 0 1  PHQ - 2 Score 0 1 0 1  Altered sleeping 0 0    Tired, decreased energy 1 1    Change in appetite 0 0    Feeling bad or failure about yourself  0 0    Trouble concentrating 0 0    Moving slowly or fidgety/restless 0 0    Suicidal thoughts 0 0    PHQ-9 Score 1 2    Difficult doing work/chores Not difficult at all Not difficult at all         06/14/2022   10:38 AM 06/10/2021   10:40 AM 06/10/2021   10:07 AM  Fall Risk   Falls in the past year? 0 0 0  Number falls in past yr: 0 0 0   Injury with Fall? 0 0 0  Risk for fall due to : No Fall Risks No Fall Risks No Fall Risks  Follow up Falls evaluation completed Falls prevention discussed Falls evaluation completed    Functional Status Survey: Is the patient deaf or have difficulty hearing?: No Does the patient have difficulty seeing, even when wearing glasses/contacts?: No Does the patient have difficulty concentrating, remembering, or making decisions?: No Does the patient have difficulty walking or climbing stairs?: No Does the patient have difficulty dressing or bathing?: No Does the patient have difficulty doing errands alone such as visiting a doctor's office or shopping?: No   Past Medical History:  Past Medical History:  Diagnosis Date   Actinic keratosis 10/20/2021   L lat calf   Ectopic pregnancy, tubal    Fibroids     Surgical History:  Past Surgical History:  Procedure Laterality Date   ABDOMINAL HYSTERECTOMY  DILATION AND CURETTAGE OF UTERUS     Fallopian Tube Surgery     OOPHORECTOMY     WISDOM TOOTH EXTRACTION      Medications:  Current Outpatient Medications on File Prior to Visit  Medication Sig   ascorbic acid (VITAMIN C) 500 MG tablet Take by mouth.   Coenzyme Q10 10 MG capsule Take by mouth.   glucosamine-chondroitin 500-400 MG tablet Take by mouth.   Magnesium Gluconate 550 MG TABS Take by mouth.   Multiple Vitamin (MULTI-VITAMINS) TABS Take 1 tablet by mouth daily.   Omega-3 1000 MG CAPS Take by mouth.   Turmeric, Curcuma Longa, (CURCUMIN) POWD 2 capsules by Miscellaneous route Two (2) times a day.   No current facility-administered medications on file prior to visit.    Allergies:  Allergies  Allergen Reactions   Sulfa Antibiotics Swelling    Social History:  Social History   Socioeconomic History   Marital status: Married    Spouse name: Not on file   Number of children: 3   Years of education: Not on file   Highest education level: Not on file  Occupational  History   Not on file  Tobacco Use   Smoking status: Never   Smokeless tobacco: Never  Substance and Sexual Activity   Alcohol use: Never   Drug use: Not Currently   Sexual activity: Not Currently  Other Topics Concern   Not on file  Social History Narrative   Not on file   Social Determinants of Health   Financial Resource Strain: Low Risk  (05/08/2021)   Overall Financial Resource Strain (CARDIA)    Difficulty of Paying Living Expenses: Not hard at all  Food Insecurity: No Food Insecurity (05/08/2021)   Hunger Vital Sign    Worried About Running Out of Food in the Last Year: Never true    [place] in the Last Year: Never true  Transportation Needs: No Transportation Needs (05/08/2021)   PRAPARE - Hydrologist (Medical): No    Lack of Transportation (Non-Medical): No  Physical Activity: Sufficiently Active (05/08/2021)   Exercise Vital Sign    Days of Exercise per Week: 7 days    Minutes of Exercise per Session: 60 min  Stress: No Stress Concern Present (05/08/2021)   [place]    Feeling of Stress : Not at all  Social Connections: Moderately Isolated (05/08/2021)   Social Connection and Isolation Panel [NHANES]    Frequency of Communication with Friends and Family: More than three times a week    Frequency of Social Gatherings with Friends and Family: More than three times a week    Attends Religious Services: Never    Marine scientist or Organizations: No    Attends Archivist Meetings: Never    Marital Status: Married  Human resources officer Violence: Not At Risk (05/08/2021)   Humiliation, Afraid, Rape, and Kick questionnaire    Fear of Current or Ex-Partner: No    Emotionally Abused: No    Physically Abused: No    Sexually Abused: No   Social History   Tobacco Use  Smoking Status Never  Smokeless Tobacco Never   Social History   Substance and Sexual  Activity  Alcohol Use Never    Family History:  Family History  Problem Relation Age of Onset   Mental illness Mother    Non-Hodgkin's lymphoma Mother    Brain cancer Mother  Lupus Father    Heart Problems Daughter    Healthy Brother    Healthy Son    Healthy Son    Breast cancer Neg Hx     Past medical history, surgical history, medications, allergies, family history and social history reviewed with patient today and changes made to appropriate areas of the chart.   ROS All other ROS negative except what is listed above and in the HPI.      Objective:    BP 130/86 (BP Location: Left Arm, Patient Position: Sitting, Cuff Size: Normal)   Pulse 84   Temp 98 F (36.7 C) (Oral)   Resp 20   Ht $R'5\' 5"'sL$  (1.651 m)   Wt 147 lb 8 oz (66.9 kg)   SpO2 98%   BMI 24.55 kg/m   Wt Readings from Last 3 Encounters:  06/14/22 147 lb 8 oz (66.9 kg)  09/14/21 155 lb 3.2 oz (70.4 kg)  06/10/21 148 lb (67.1 kg)    Physical Exam Vitals and nursing note reviewed.  Constitutional:      General: She is awake. She is not in acute distress.    Appearance: She is well-developed and well-groomed. She is not ill-appearing or toxic-appearing.  HENT:     Head: Normocephalic and atraumatic.     Right Ear: Hearing, tympanic membrane, ear canal and external ear normal. No drainage.     Left Ear: Hearing, tympanic membrane, ear canal and external ear normal. No drainage.     Nose: Nose normal.     Right Sinus: No maxillary sinus tenderness or frontal sinus tenderness.     Left Sinus: No maxillary sinus tenderness or frontal sinus tenderness.     Mouth/Throat:     Mouth: Mucous membranes are moist.     Pharynx: Oropharynx is clear. Uvula midline. No pharyngeal swelling, oropharyngeal exudate or posterior oropharyngeal erythema.  Eyes:     General: Lids are normal.        Right eye: No discharge.        Left eye: No discharge.     Extraocular Movements: Extraocular movements intact.      Conjunctiva/sclera: Conjunctivae normal.     Pupils: Pupils are equal, round, and reactive to light.     Visual Fields: Right eye visual fields normal and left eye visual fields normal.  Neck:     Thyroid: No thyromegaly.     Vascular: No carotid bruit.     Trachea: Trachea normal.  Cardiovascular:     Rate and Rhythm: Normal rate and regular rhythm.     Heart sounds: Normal heart sounds. No murmur heard.    No gallop.  Pulmonary:     Effort: Pulmonary effort is normal. No accessory muscle usage or respiratory distress.     Breath sounds: Normal breath sounds.  Abdominal:     General: Bowel sounds are normal.     Palpations: Abdomen is soft. There is no hepatomegaly or splenomegaly.     Tenderness: There is no abdominal tenderness.  Musculoskeletal:        General: Normal range of motion.     Cervical back: Normal range of motion and neck supple.     Right lower leg: No edema.     Left lower leg: No edema.  Lymphadenopathy:     Head:     Right side of head: No submental, submandibular, tonsillar, preauricular or posterior auricular adenopathy.     Left side of head: No submental, submandibular, tonsillar, preauricular or  posterior auricular adenopathy.     Cervical: No cervical adenopathy.  Skin:    General: Skin is warm and dry.     Capillary Refill: Capillary refill takes less than 2 seconds.     Findings: No rash.  Neurological:     Mental Status: She is alert and oriented to person, place, and time.     Gait: Gait is intact.     Deep Tendon Reflexes: Reflexes are normal and symmetric.     Reflex Scores:      Brachioradialis reflexes are 2+ on the right side and 2+ on the left side.      Patellar reflexes are 2+ on the right side and 2+ on the left side. Psychiatric:        Attention and Perception: Attention normal.        Mood and Affect: Mood normal.        Speech: Speech normal.        Behavior: Behavior normal. Behavior is cooperative.        Thought Content:  Thought content normal.        Judgment: Judgment normal.    Results for orders placed or performed in visit on 09/14/21  Rocky mtn spotted fvr abs pnl(IgG+IgM)  Result Value Ref Range   RMSF IgG Negative Negative   RMSF IgM 0.37 0.00 - 7.89 index  Ehrlichia antibody panel  Result Value Ref Range   E.Chaffeensis (HME) IgG Negative Neg:<1:64   E. Chaffeensis (HME) IgM Titer Negative Neg:<1:20   HGE IgG Titer Negative Neg:<1:64   HGE IgM Titer Negative Neg:<1:20  Babesia microti Antibody Panel  Result Value Ref Range   Babesia microti IgM <1:10 Neg:<1:10   Babesia microti IgG <1:10 Neg:<1:10  Comprehensive metabolic panel  Result Value Ref Range   Glucose 90 70 - 99 mg/dL   BUN 22 8 - 27 mg/dL   Creatinine, Ser 0.91 0.57 - 1.00 mg/dL   eGFR 70 >59 mL/min/1.73   BUN/Creatinine Ratio 24 12 - 28   Sodium 142 134 - 144 mmol/L   Potassium 3.9 3.5 - 5.2 mmol/L   Chloride 101 96 - 106 mmol/L   CO2 24 20 - 29 mmol/L   Calcium 10.2 8.7 - 10.3 mg/dL   Total Protein 6.9 6.0 - 8.5 g/dL   Albumin 5.0 (H) 3.8 - 4.8 g/dL   Globulin, Total 1.9 1.5 - 4.5 g/dL   Albumin/Globulin Ratio 2.6 (H) 1.2 - 2.2   Bilirubin Total 0.3 0.0 - 1.2 mg/dL   Alkaline Phosphatase 65 44 - 121 IU/L   AST 19 0 - 40 IU/L   ALT 20 0 - 32 IU/L  CBC with Differential/Platelet  Result Value Ref Range   WBC 5.2 3.4 - 10.8 x10E3/uL   RBC 4.25 3.77 - 5.28 x10E6/uL   Hemoglobin 13.7 11.1 - 15.9 g/dL   Hematocrit 39.3 34.0 - 46.6 %   MCV 93 79 - 97 fL   MCH 32.2 26.6 - 33.0 pg   MCHC 34.9 31.5 - 35.7 g/dL   RDW 11.6 (L) 11.7 - 15.4 %   Platelets 232 150 - 450 x10E3/uL   Neutrophils 58 Not Estab. %   Lymphs 24 Not Estab. %   Monocytes 14 Not Estab. %   Eos 3 Not Estab. %   Basos 1 Not Estab. %   Neutrophils Absolute 3.1 1.4 - 7.0 x10E3/uL   Lymphocytes Absolute 1.2 0.7 - 3.1 x10E3/uL   Monocytes Absolute 0.7 0.1 - 0.9 x10E3/uL  EOS (ABSOLUTE) 0.1 0.0 - 0.4 x10E3/uL   Basophils Absolute 0.0 0.0 - 0.2 x10E3/uL    Immature Granulocytes 0 Not Estab. %   Immature Grans (Abs) 0.0 0.0 - 0.1 x10E3/uL  Lyme disease, western blot  Result Value Ref Range     IgG P93 Ab. Absent      IgG P66 Ab. Absent      IgG P58 Ab. Absent      IgG P45 Ab. Absent      IgG P41 Ab. Absent      IgG P39 Ab. Absent      IgG P30 Ab. Absent      IgG P28 Ab. Absent      IgG P23 Ab. Absent      IgG P18 Ab. Absent    Lyme IgG Wb Negative      IgM P41 Ab. Absent      IgM P39 Ab. Absent      IgM P23 Ab. Absent    Lyme IgM Wb Negative       Assessment & Plan:   Problem List Items Addressed This Visit       Cardiovascular and Mediastinum   White coat syndrome without diagnosis of hypertension - Primary    Chronic, ongoing issue for years, she checks BP at home and is often <130/80.  Stable on check today.  Recommend she continue to monitor at home and focus on DASH diet.  LABS today: CBC, CMP, TSH, lipid.  Initiate medication as needed.      Relevant Orders   CBC with Differential/Platelet   Comprehensive metabolic panel   TSH     Musculoskeletal and Integument   Osteopenia of left forearm    Noted on DEXA imaging, she wishes to repeat this year if possible -- discussed this may not be covered but will ordered.  Continue Vitamin D3 and calcium rich diet at home.        Relevant Orders   VITAMIN D 25 Hydroxy (Vit-D Deficiency, Fractures)   DG Bone Density     Other   Elevated low density lipoprotein (LDL) cholesterol level    Noted on past labs from 2018, recommend diet focus at home and regular activity.  Lipid panel today. ASCVD 7.4%.      Relevant Orders   Comprehensive metabolic panel   Lipid Panel w/o Chol/HDL Ratio   Other Visit Diagnoses     Encounter for screening mammogram for malignant neoplasm of breast       Mammogram ordered.   Relevant Orders   MM 3D SCREEN BREAST BILATERAL   Pneumococcal vaccination given       PCV20 vaccine today in office.   Relevant Orders   Pneumococcal conjugate  vaccine 20-valent (Prevnar 20)   Need for shingles vaccine       Shingrix vaccines sent to pharmacy.   Relevant Medications   Zoster Vaccine Adjuvanted [facility]) injection (Start on 07/14/2022)   Zoster Vaccine Adjuvanted [facility]) injection (Start on 09/15/2022)   Encounter for annual physical exam       Annual physical today with labs and health maintenance reviewed, discussed with patient.   Relevant Orders   CBC with Differential/Platelet   TSH        Follow up plan: Return in about 1 year (around 06/15/2023) for Annual physical.   LABORATORY TESTING:  - Pap smear: not applicable  IMMUNIZATIONS:   - Tdap: Tetanus vaccination status reviewed: will get outpatient - Influenza: Up To Date - Pneumovax:  Administered today - Prevnar: Not applicable - HPV: Not applicable - Zostavax vaccine:  sent to pharmacy  SCREENING: -Mammogram: Up to date -- has letter and will schedule - Colonoscopy: Up to date -- cologuard -- 05/18/24 next - Bone Density: Up to date -- osteopenia -Hearing Test: Not applicable  -Spirometry: Not applicable   PATIENT COUNSELING:   Advised to take 1 mg of folate supplement per day if capable of pregnancy.   Sexuality: Discussed sexually transmitted diseases, partner selection, use of condoms, avoidance of unintended pregnancy  and contraceptive alternatives.   Advised to avoid cigarette smoking.  I discussed with the patient that most people either abstain from alcohol or drink within safe limits (<=14/week and <=4 drinks/occasion for males, <=7/weeks and <= 3 drinks/occasion for females) and that the risk for alcohol disorders and other health effects rises proportionally with the number of drinks per week and how often a drinker exceeds daily limits.  Discussed cessation/primary prevention of drug use and availability of treatment for abuse.   Diet: Encouraged to adjust caloric intake to maintain  or achieve ideal body weight, to reduce intake of  dietary saturated fat and total fat, to limit sodium intake by avoiding high sodium foods and not adding table salt, and to maintain adequate dietary potassium and calcium preferably from fresh fruits, vegetables, and low-fat dairy products.    Stressed the importance of regular exercise  Injury prevention: Discussed safety belts, safety helmets, smoke detector, smoking near bedding or upholstery.   Dental health: Discussed importance of regular tooth brushing, flossing, and dental visits.    NEXT PREVENTATIVE PHYSICAL DUE IN 1 YEAR. Return in about 1 year (around 06/15/2023) for Annual physical.

## 2022-06-15 LAB — CBC WITH DIFFERENTIAL/PLATELET
Basophils Absolute: 0.1 10*3/uL (ref 0.0–0.2)
Basos: 1 %
EOS (ABSOLUTE): 0.1 10*3/uL (ref 0.0–0.4)
Eos: 3 %
Hematocrit: 41.6 % (ref 34.0–46.6)
Hemoglobin: 14.1 g/dL (ref 11.1–15.9)
Immature Grans (Abs): 0 10*3/uL (ref 0.0–0.1)
Immature Granulocytes: 0 %
Lymphocytes Absolute: 1.2 10*3/uL (ref 0.7–3.1)
Lymphs: 24 %
MCH: 31.7 pg (ref 26.6–33.0)
MCHC: 33.9 g/dL (ref 31.5–35.7)
MCV: 94 fL (ref 79–97)
Monocytes Absolute: 0.6 10*3/uL (ref 0.1–0.9)
Monocytes: 12 %
Neutrophils Absolute: 3.1 10*3/uL (ref 1.4–7.0)
Neutrophils: 60 %
Platelets: 223 10*3/uL (ref 150–450)
RBC: 4.45 x10E6/uL (ref 3.77–5.28)
RDW: 11.6 % — ABNORMAL LOW (ref 11.7–15.4)
WBC: 5.1 10*3/uL (ref 3.4–10.8)

## 2022-06-15 LAB — COMPREHENSIVE METABOLIC PANEL
ALT: 25 IU/L (ref 0–32)
AST: 24 IU/L (ref 0–40)
Albumin/Globulin Ratio: 2.6 — ABNORMAL HIGH (ref 1.2–2.2)
Albumin: 5 g/dL — ABNORMAL HIGH (ref 3.9–4.9)
Alkaline Phosphatase: 64 IU/L (ref 44–121)
BUN/Creatinine Ratio: 22 (ref 12–28)
BUN: 17 mg/dL (ref 8–27)
Bilirubin Total: 0.7 mg/dL (ref 0.0–1.2)
CO2: 26 mmol/L (ref 20–29)
Calcium: 10 mg/dL (ref 8.7–10.3)
Chloride: 103 mmol/L (ref 96–106)
Creatinine, Ser: 0.78 mg/dL (ref 0.57–1.00)
Globulin, Total: 1.9 g/dL (ref 1.5–4.5)
Glucose: 92 mg/dL (ref 70–99)
Potassium: 4.4 mmol/L (ref 3.5–5.2)
Sodium: 144 mmol/L (ref 134–144)
Total Protein: 6.9 g/dL (ref 6.0–8.5)
eGFR: 84 mL/min/{1.73_m2} (ref >59)

## 2022-06-15 LAB — LIPID PANEL W/O CHOL/HDL RATIO
Cholesterol, Total: 243 mg/dL — ABNORMAL HIGH (ref 100–199)
HDL: 48 mg/dL (ref >39)
LDL Chol Calc (NIH): 160 mg/dL — ABNORMAL HIGH (ref 0–99)
Triglycerides: 190 mg/dL — ABNORMAL HIGH (ref 0–149)
VLDL Cholesterol Cal: 35 mg/dL (ref 5–40)

## 2022-06-15 LAB — TSH: TSH: 3.28 u[IU]/mL (ref 0.450–4.500)

## 2022-06-15 LAB — VITAMIN D 25 HYDROXY (VIT D DEFICIENCY, FRACTURES): Vit D, 25-Hydroxy: 79.7 ng/mL (ref 30.0–100.0)

## 2022-06-15 NOTE — Progress Notes (Signed)
Contacted via [place] morning Christina Manning, your labs have returned and overall remain stable with exception of cholesterol, however LDL has come down a little from 179 to 160 and cardiac risk score (which I will place below) remains lower then 10%.  At this time I continue to recommend heavy focus on diet and regular activity, which I know you prefer too as you are a very active person at baseline.  Taking Omega 3 gummies (two of them) once daily would also be good.  Any questions? The 10-year ASCVD risk score (Arnett DK, et al., 2019) is: 6.7%   Values used to calculate the score:     Age: 66 years     Sex: Female     Is Non-Hispanic African American: No     Diabetic: No     Tobacco smoker: No     Systolic Blood Pressure: 820 mmHg     Is BP treated: No     HDL Cholesterol: 48 mg/dL     Total Cholesterol: 243 mg/dL Keep being amazing!!  Thank you for allowing me to participate in your care.  I appreciate you. Kindest regards, Janai Maudlin

## 2022-08-24 ENCOUNTER — Ambulatory Visit: Payer: Self-pay | Admitting: *Deleted

## 2022-08-24 ENCOUNTER — Ambulatory Visit: Payer: Medicare Other | Admitting: Family Medicine

## 2022-08-24 NOTE — Telephone Encounter (Signed)
  Chief Complaint: pain right hand, difficulty gripping  Symptoms: pain and numbness right 3rd finger to palm and wrist, unable to grip. Movement causes pain. Minimal relief from tylenol. Using cool for pain . Pain level 7-8 Frequency: 5 days  Pertinent Negatives: Patient denies swelling no redness  Disposition: '[]'$ ED /'[]'$ Urgent Care (no appt availability in office) / '[x]'$ Appointment(In office/virtual)/ '[]'$  [facility] [facility]/ '[]'$ Home Care/ '[]'$ Refused Recommended Disposition /'[]'$ [facility]/ '[]'$  Follow-up with PCP Additional Notes:   Appt rescheduled for tomorrow .   Reason for Disposition  Numbness (i.e., loss of sensation) in hand or fingers  (Exception: Just tingling; numbness present > 2 weeks.)  Answer Assessment - Initial Assessment Questions 1. ONSET: "When did the pain start?"     5 days ago numbness in 3rd finger on right hand started. 2. LOCATION: "Where is the pain located?"     Right hand 3rd finger to palm to wrist 3. PAIN: "How bad is the pain?" (Scale 1-10; or mild, moderate, severe)   - MILD (1-3): doesn't interfere with normal activities   - MODERATE (4-7): interferes with normal activities (e.g., work or school) or awakens from sleep   - SEVERE (8-10): excruciating pain, unable to use hand at all     Moderate 7-8.  4. WORK OR EXERCISE: "Has there been any recent work or exercise that involved this part (i.e., hand or wrist) of the body?"     no 5. CAUSE: "What do you think is causing the pain?"     Not sure  6. AGGRAVATING FACTORS: "What makes the pain worse?" (e.g., using computer)     Movement  7. OTHER SYMPTOMS: "Do you have any other symptoms?" (e.g., neck pain, swelling, rash, numbness, fever)     Numbness 3rd finger on right hand to palm and wrist, gripping hurts right hand. Minimal relief of pain with tylenol 8. PREGNANCY: "Is there any chance you are pregnant?" "When was your last menstrual period?"     na  Protocols used: Hand and Wrist  Pain-A-AH

## 2022-08-24 NOTE — Telephone Encounter (Signed)
Called patient back ,no answer LVMTCB to confirm appt time for tomorrow 08/25/22 is at 9:20 am . Wrong date and time previously scheduled for patient during NT call.

## 2022-08-24 NOTE — Telephone Encounter (Signed)
Called patient on [phone] and no VM available to confirm appt time tomorrow for 9:20 am . Called patient again on #[phone] and left message to clarify time of appt tomorrow at 9:20 am. Wrong time given during previous NT encounter today and patient has not called back to confirm to come in at 9:20 am for the only appt available with PCP.

## 2022-08-24 NOTE — Telephone Encounter (Signed)
Noted  

## 2022-08-25 ENCOUNTER — Encounter: Payer: Self-pay | Admitting: Nurse Practitioner

## 2022-08-25 ENCOUNTER — Ambulatory Visit: Payer: Medicare Other | Admitting: Nurse Practitioner

## 2022-08-25 VITALS — BP 124/84 | HR 99 | Temp 97.5°F | Resp 18 | Wt 150.9 lb

## 2022-08-25 DIAGNOSIS — M65331 Trigger finger, right middle finger: Secondary | ICD-10-CM | POA: Insufficient documentation

## 2022-08-25 NOTE — Patient Instructions (Signed)
Trigger Finger  Trigger finger, also called stenosing tenosynovitis,  is a condition that causes a finger to get stuck in a bent position. Each finger has a tendon, which is a tough, cord-like tissue that connects muscle to bone, and each tendon passes through a tunnel of tissue called a tendon sheath. To move your finger, your tendon needs to glide freely through the sheath. Trigger finger happens when the tendon or the sheath thickens, making it difficult to move your finger. Trigger finger can affect any finger or a thumb. It may affect more than one finger. Mild cases may clear up with rest and medicine. Severe cases require more treatment. What are the causes? Trigger finger is caused by a thickened finger tendon or tendon sheath. The cause of this thickening is not known. What increases the risk? The following factors may make you more likely to develop this condition: Doing activities that require a strong grip. Having rheumatoid arthritis, gout, or diabetes. Being 40-60 years old. Being female. What are the signs or symptoms? Symptoms of this condition include: Pain when bending or straightening your finger. Tenderness or swelling where your finger attaches to the palm of your hand. A lump in the palm of your hand or on the inside of your finger. Hearing a noise like a pop or a snap when you try to straighten your finger. Feeling a catching or locking sensation when you try to straighten your finger. Being unable to straighten your finger. How is this diagnosed? This condition is diagnosed based on your symptoms and a physical exam. How is this treated? This condition may be treated by: Resting your finger and avoiding activities that make symptoms worse. Wearing a finger splint to keep your finger extended. Taking NSAIDs, such as ibuprofen, to relieve pain and swelling. Doing gentle exercises to stretch the finger as told by your health care provider. Having medicine that reduces  swelling and inflammation (steroids) injected into the tendon sheath. Injections may need to be repeated. Having surgery to open the tendon sheath. This may be done if other treatments do not work and you cannot straighten your finger. You may need physical therapy after surgery. Follow these instructions at home: If you have a splint: Wear the splint as told by your health care provider. Remove it only as told by your health care provider. Loosen it if your fingers tingle, become numb, or turn cold and blue. Keep it clean. If the splint is not waterproof: Do not let it get wet. Cover it with a watertight covering when you take a bath or shower. Managing pain, stiffness, and swelling     If directed, apply heat to the affected area as often as told by your health care provider. Use the heat source that your health care provider recommends, such as a moist heat pack or a heating pad. Place a towel between your skin and the heat source. Leave the heat on for 20-30 minutes. Remove the heat if your skin turns bright red. This is especially important if you are unable to feel pain, heat, or cold. You may have a greater risk of getting burned. If directed, put ice on the painful area. To do this: If you have a removable splint, remove it as told by your health care provider. Put ice in a plastic bag. Place a towel between your skin and the bag or between your splint and the bag. Leave the ice on for 20 minutes, 2-3 times a day.  Activity Rest   your finger as told by your health care provider. Avoid activities that make the pain worse. Return to your normal activities as told by your health care provider. Ask your health care provider what activities are safe for you. Do exercises as told by your health care provider. Ask your health care provider when it is safe to drive if you have a splint on your hand. General instructions Take over-the-counter and prescription medicines only as told by  your health care provider. Keep all follow-up visits as told by your health care provider. This is important. Contact a health care provider if: Your symptoms are not improving with home care. Summary Trigger finger, also called stenosing tenosynovitis, causes your finger to get stuck in a bent position. This can make it difficult and painful to straighten your finger. This condition develops when a finger tendon or tendon sheath thickens. Treatment may include resting your finger, wearing a splint, and taking medicines. In severe cases, surgery to open the tendon sheath may be needed. This information is not intended to replace advice given to you by your health care provider. Make sure you discuss any questions you have with your health care provider. Document Revised: 01/29/2019 Document Reviewed: 01/29/2019 Elsevier Patient Education  2023 [employer] Inc[employer]  

## 2022-08-25 NOTE — Assessment & Plan Note (Signed)
Acute for 6 days -- patient very active and right handed, needs access to mobility.  Referral to ortho placed, discussed with her possibility of steroid injection to help release.  Provided her with stretches to perform at home.  Recommend continue Tylenol as needed + use Voltaren gel and massage tendon to palm right hand.

## 2022-08-25 NOTE — Telephone Encounter (Addendum)
Called and confirmed patient is aware of 9:20 appointment time this morning.

## 2022-08-25 NOTE — Progress Notes (Signed)
BP 124/84 (BP Location: Left Arm, Patient Position: Sitting, Cuff Size: Normal)   Pulse 99   Temp (!) 97.5 F (36.4 C) (Oral)   Resp 18   Wt 150 lb 14.4 oz (68.4 kg)   SpO2 99%   BMI 25.11 kg/m    Subjective:    Patient ID: Christina Manning, female    DOB: [birth year], 66 y.o.   MRN: [id number]  HPI: Christina Manning is a 66 y.o. female  Chief Complaint  Patient presents with   Hand Pain    Pt states she has been having pain in her R wrist and R palm for the past 6 days. States she also has numbness in her middle finger as well. States it she applies pressure to the palm or wrist area, she gets a shooting pain into her middle finger. States she cannot grip things properly. Has tried heat, ice, tylenol, and massage to the area for relief.    NUMBNESS Thinks she pinched a nerve in her right hand.  Started 6 days ago.  She can not put saddle due to issues with gripping and pain.  Preventing her from exercising, can only exercise one side of body.  Pain radiates from mid palm and up into middle finger.  Feels worse when gripping or reaching down.  No recent trauma or injuries to hand.  At times middle finger feels stuck. Duration: days Onset: sudden Location: right hand Bilateral: no Symmetric: no Decreased sensation: no  Weakness: yes Pain: yes Quality:  sharp, aching, and shooting Severity: 7/10  Frequency: intermittent Trauma: no Recent illness: no Diabetes: no Thyroid disease: no  HIV: no  Alcoholism: no  Spinal cord injury: no Alleviating factors: Ice to area Aggravating factors: reaching down and gripping small items Status: uncontrolled Treatments attempted:  icing area  Relevant past medical, surgical, family and social history reviewed and updated as indicated. Interim medical history since our last visit reviewed. Allergies and medications reviewed and updated.  Review of Systems  Constitutional:  Negative for activity change, appetite change, diaphoresis,  fatigue and fever.  Respiratory:  Negative for cough, chest tightness and shortness of breath.   Cardiovascular:  Negative for chest pain, palpitations and leg swelling.  Musculoskeletal:  Positive for arthralgias.  Neurological:  Positive for numbness.  Psychiatric/Behavioral: Negative.      Per HPI unless specifically indicated above     Objective:    BP 124/84 (BP Location: Left Arm, Patient Position: Sitting, Cuff Size: Normal)   Pulse 99   Temp (!) 97.5 F (36.4 C) (Oral)   Resp 18   Wt 150 lb 14.4 oz (68.4 kg)   SpO2 99%   BMI 25.11 kg/m   Wt Readings from Last 3 Encounters:  08/25/22 150 lb 14.4 oz (68.4 kg)  06/14/22 147 lb 8 oz (66.9 kg)  09/14/21 155 lb 3.2 oz (70.4 kg)    Physical Exam Vitals and nursing note reviewed.  Constitutional:      General: She is awake. She is not in acute distress.    Appearance: She is well-developed and well-groomed. She is not ill-appearing or toxic-appearing.  HENT:     Head: Normocephalic.     Right Ear: Hearing normal.     Left Ear: Hearing normal.     Nose: Nose normal.  Eyes:     General: Lids are normal.        Right eye: No discharge.        Left eye: No discharge.  Conjunctiva/sclera: Conjunctivae normal.     Pupils: Pupils are equal, round, and reactive to light.  Neck:     Thyroid: No thyromegaly.     Vascular: No carotid bruit.  Cardiovascular:     Rate and Rhythm: Normal rate and regular rhythm.     Heart sounds: Normal heart sounds. No murmur heard.    No gallop.  Pulmonary:     Effort: Pulmonary effort is normal. No accessory muscle usage or respiratory distress.     Breath sounds: Normal breath sounds.  Abdominal:     General: Bowel sounds are normal.     Palpations: Abdomen is soft.  Musculoskeletal:     Right hand: Tenderness present. No swelling or bony tenderness. Decreased range of motion. Decreased strength of thumb/finger opposition and wrist extension. Normal sensation. Normal capillary  refill. Normal pulse.     Left hand: Normal.     Cervical back: Normal range of motion and neck supple.     Right lower leg: No edema.     Left lower leg: No edema.     Comments: Decrease in grip strength right side. Middle finger getting stuck with attempts.  Skin:    General: Skin is warm and dry.  Neurological:     Mental Status: She is alert and oriented to person, place, and time.  Psychiatric:        Attention and Perception: Attention normal.        Mood and Affect: Mood normal.        Speech: Speech normal.        Behavior: Behavior normal. Behavior is cooperative.        Thought Content: Thought content normal.     Results for orders placed or performed in visit on 06/14/22  CBC with Differential/Platelet  Result Value Ref Range   WBC 5.1 3.4 - 10.8 x10E3/uL   RBC 4.45 3.77 - 5.28 x10E6/uL   Hemoglobin 14.1 11.1 - 15.9 g/dL   Hematocrit 41.6 34.0 - 46.6 %   MCV 94 79 - 97 fL   MCH 31.7 26.6 - 33.0 pg   MCHC 33.9 31.5 - 35.7 g/dL   RDW 11.6 (L) 11.7 - 15.4 %   Platelets 223 150 - 450 x10E3/uL   Neutrophils 60 Not Estab. %   Lymphs 24 Not Estab. %   Monocytes 12 Not Estab. %   Eos 3 Not Estab. %   Basos 1 Not Estab. %   Neutrophils Absolute 3.1 1.4 - 7.0 x10E3/uL   Lymphocytes Absolute 1.2 0.7 - 3.1 x10E3/uL   Monocytes Absolute 0.6 0.1 - 0.9 x10E3/uL   EOS (ABSOLUTE) 0.1 0.0 - 0.4 x10E3/uL   Basophils Absolute 0.1 0.0 - 0.2 x10E3/uL   Immature Granulocytes 0 Not Estab. %   Immature Grans (Abs) 0.0 0.0 - 0.1 x10E3/uL  Comprehensive metabolic panel  Result Value Ref Range   Glucose 92 70 - 99 mg/dL   BUN 17 8 - 27 mg/dL   Creatinine, Ser 0.78 0.57 - 1.00 mg/dL   eGFR 84 >59 mL/min/1.73   BUN/Creatinine Ratio 22 12 - 28   Sodium 144 134 - 144 mmol/L   Potassium 4.4 3.5 - 5.2 mmol/L   Chloride 103 96 - 106 mmol/L   CO2 26 20 - 29 mmol/L   Calcium 10.0 8.7 - 10.3 mg/dL   Total Protein 6.9 6.0 - 8.5 g/dL   Albumin 5.0 (H) 3.9 - 4.9 g/dL   Globulin, Total 1.9  1.5 - 4.5 g/dL  Albumin/Globulin Ratio 2.6 (H) 1.2 - 2.2   Bilirubin Total 0.7 0.0 - 1.2 mg/dL   Alkaline Phosphatase 64 44 - 121 IU/L   AST 24 0 - 40 IU/L   ALT 25 0 - 32 IU/L  Lipid Panel w/o Chol/HDL Ratio  Result Value Ref Range   Cholesterol, Total 243 (H) 100 - 199 mg/dL   Triglycerides 190 (H) 0 - 149 mg/dL   HDL 48 >39 mg/dL   VLDL Cholesterol Cal 35 5 - 40 mg/dL   LDL Chol Calc (NIH) 160 (H) 0 - 99 mg/dL  TSH  Result Value Ref Range   TSH 3.280 0.450 - 4.500 uIU/mL  VITAMIN D 25 Hydroxy (Vit-D Deficiency, Fractures)  Result Value Ref Range   Vit D, 25-Hydroxy 79.7 30.0 - 100.0 ng/mL      Assessment & Plan:   Problem List Items Addressed This Visit       Musculoskeletal and Integument   Trigger middle finger of right hand - Primary    Acute for 6 days -- patient very active and right handed, needs access to mobility.  Referral to ortho placed, discussed with her possibility of steroid injection to help release.  Provided her with stretches to perform at home.  Recommend continue Tylenol as needed + use Voltaren gel and massage tendon to palm right hand.      Relevant Orders   Ambulatory referral to Orthopedics     Follow up plan: Return if symptoms worsen or fail to improve.

## 2022-09-01 ENCOUNTER — Ambulatory Visit: Payer: Medicare Other | Admitting: Nurse Practitioner

## 2022-10-09 IMAGING — MG MM DIGITAL SCREENING BILAT W/ TOMO AND CAD
8 series · 8 of 24 positions shown · non-contrast
Comparison: Previous exam(s).

CLINICAL DATA: Screening.

EXAM:
DIGITAL SCREENING BILATERAL MAMMOGRAM WITH TOMOSYNTHESIS AND CAD
TECHNIQUE: Bilateral screening digital craniocaudal and mediolateral oblique
mammograms were obtained. Bilateral screening digital breast
tomosynthesis was performed. The images were evaluated with
computer-aided detection.

[L MLO synth-2D]
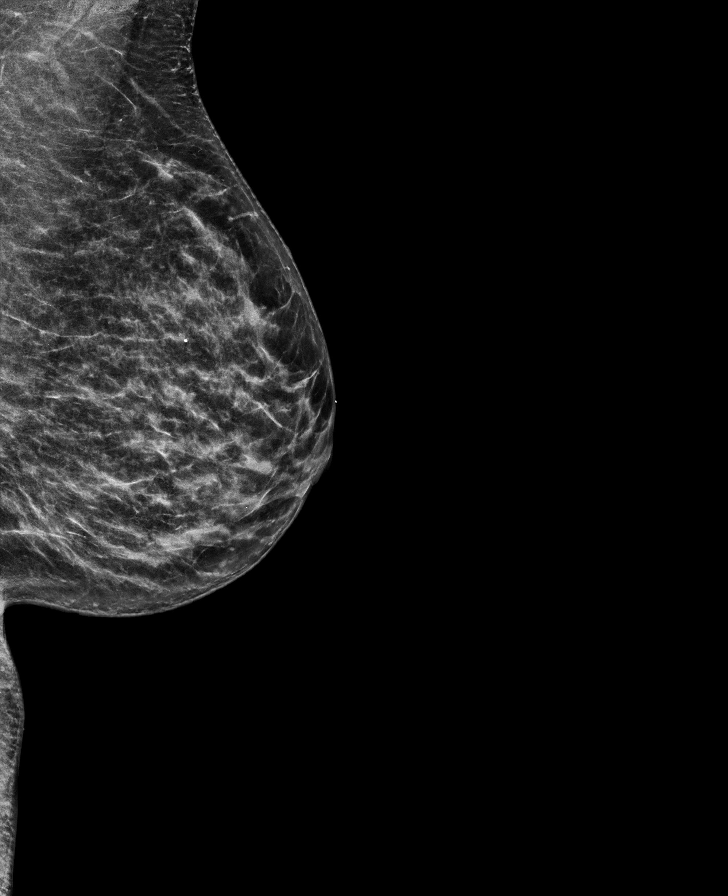

[L CC synth-2D]
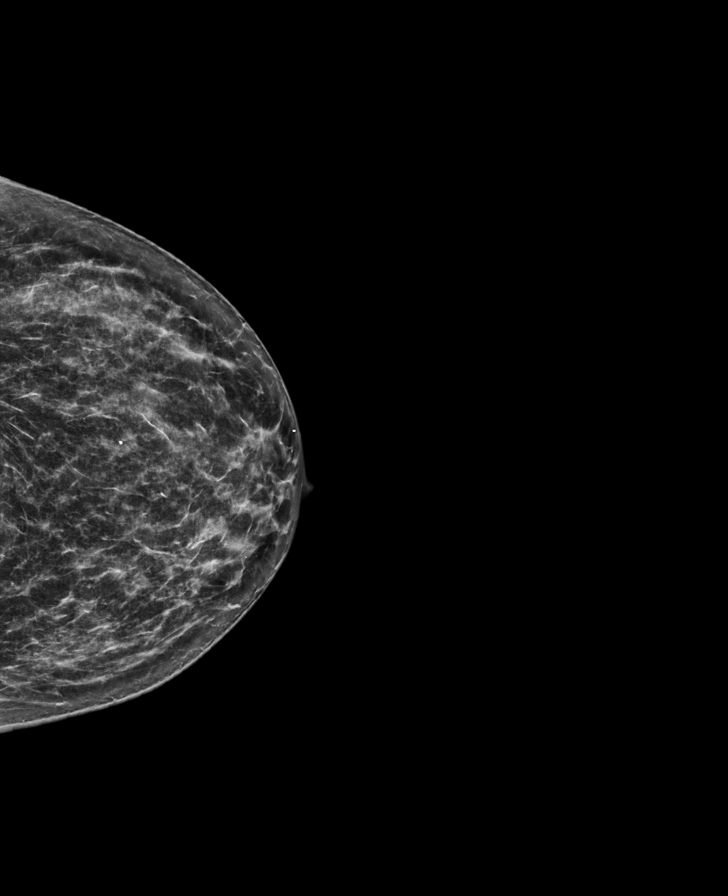

[R CC synth-2D]
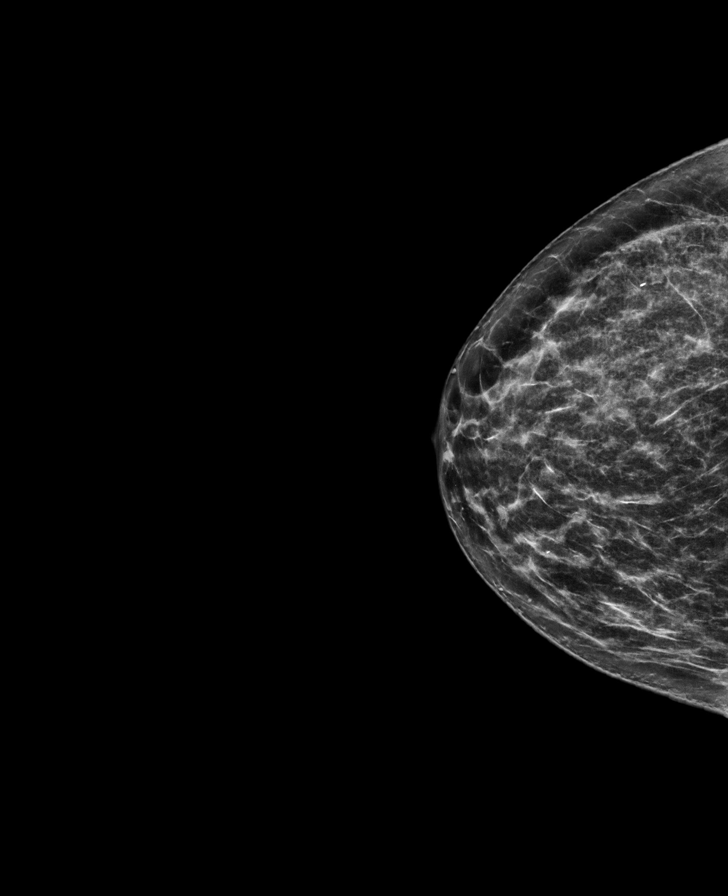

[R MLO synth-2D]
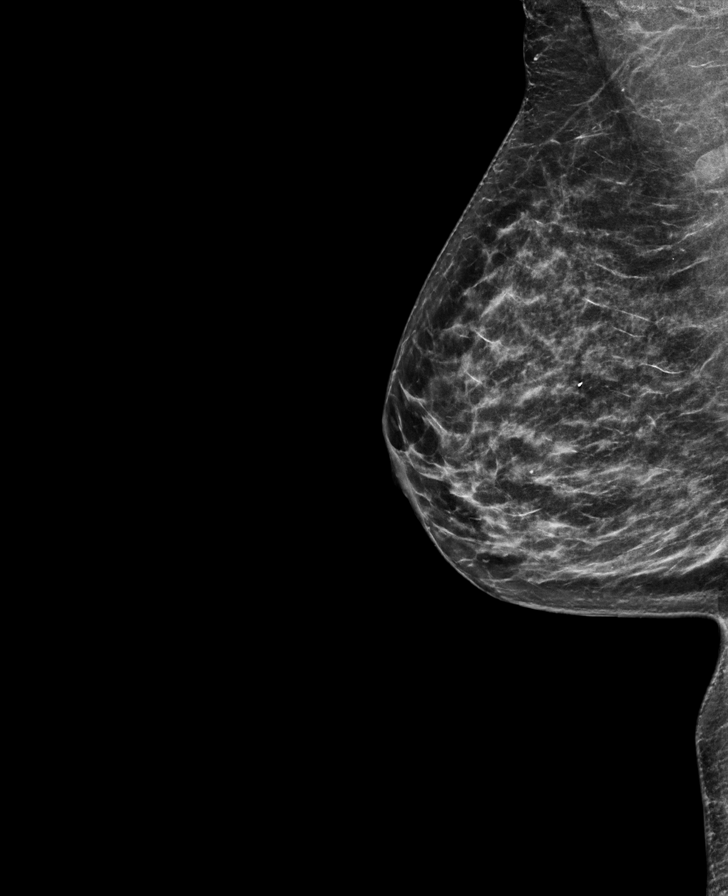

[R CC tomo · tomo slice 33/65.0]
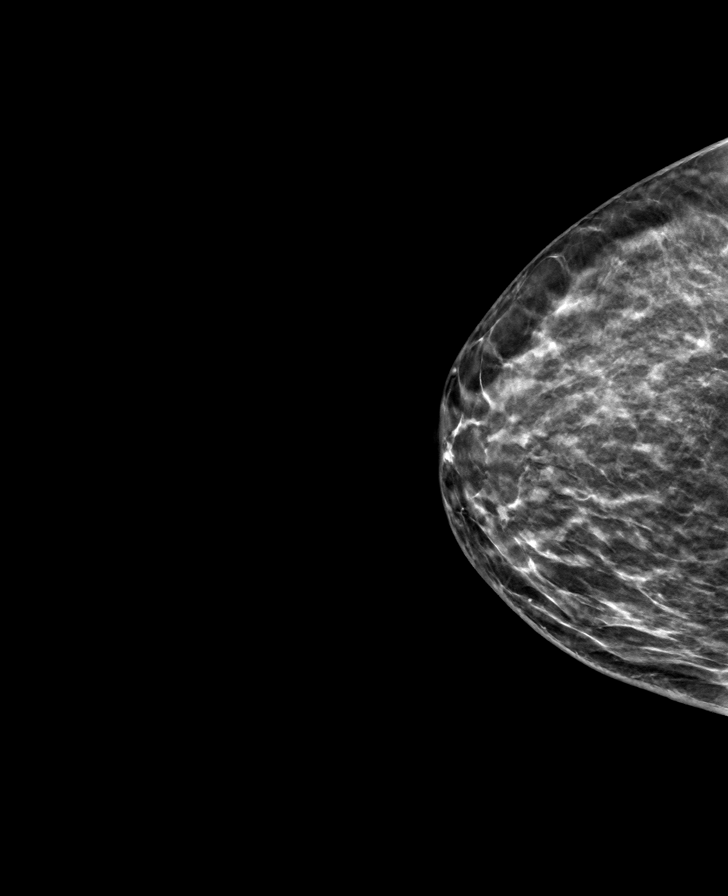

[L CC tomo · tomo slice 32/63.0]
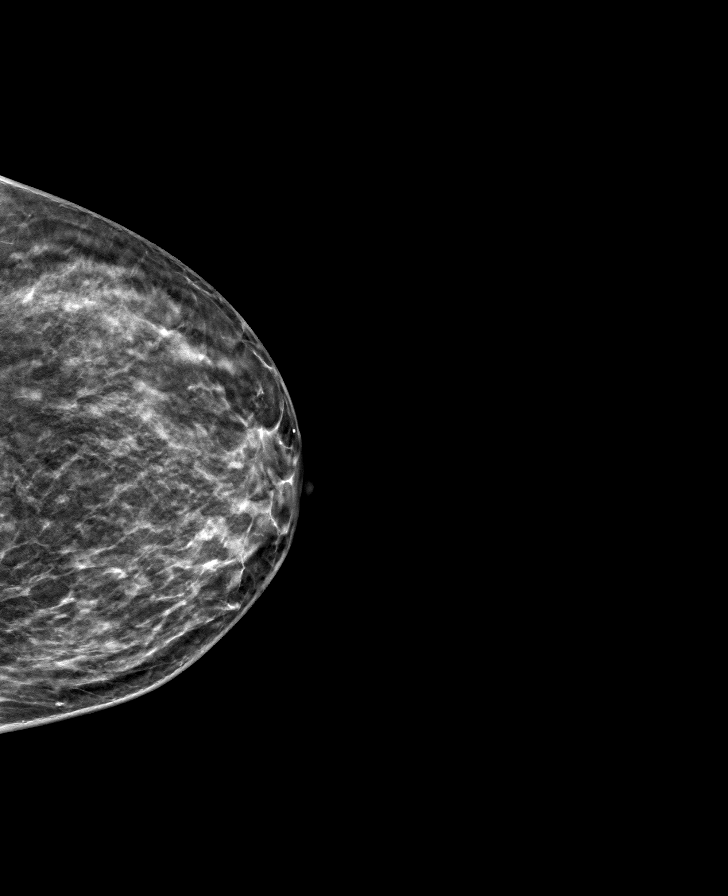

[R MLO tomo · tomo slice 33/66.0]
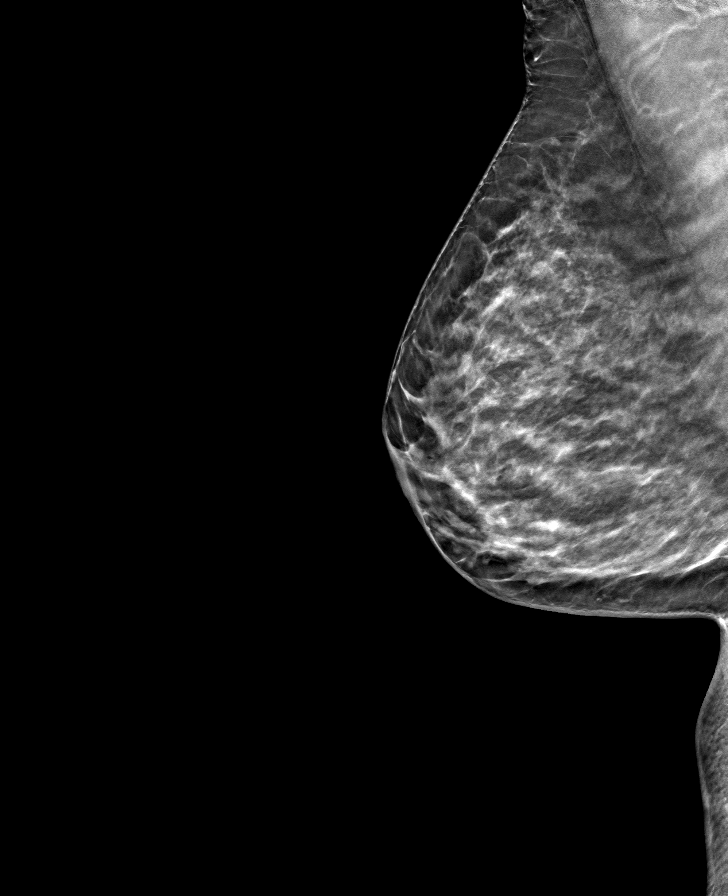

[L MLO tomo · tomo slice 31/60.0]
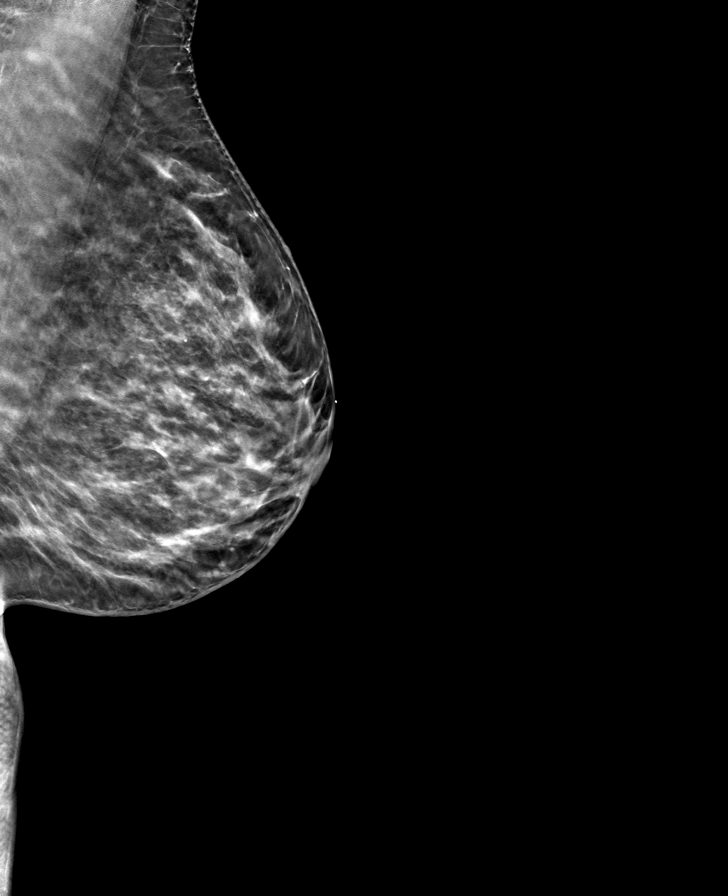

[8 of 24 positions shown; findings below may reference images not displayed]

ACR Breast Density Category c: The breast tissue is heterogeneously
dense, which may obscure small masses.
FINDINGS: In the right breast, a possible asymmetry warrants further
evaluation. In the left breast, no findings suspicious for
malignancy.
IMPRESSION: Further evaluation is suggested for possible asymmetry in the right
breast.

RECOMMENDATION:
Diagnostic mammogram and possibly ultrasound of the right breast.
(Code:[id number])

The patient will be contacted regarding the findings, and additional
imaging will be scheduled.

BI-RADS CATEGORY  0: Incomplete. Need additional imaging evaluation
and/or prior mammograms for comparison.

## 2022-10-26 ENCOUNTER — Ambulatory Visit
Admission: RE | Admit: 2022-10-26 | Discharge: 2022-10-26 | Disposition: A | Discharge status: Home / Self Care | Payer: Medicare Other | Source: Ambulatory Visit | Attending: Nurse Practitioner | Admitting: Nurse Practitioner

## 2022-10-26 DIAGNOSIS — Z1231 Encounter for screening mammogram for malignant neoplasm of breast: Secondary | ICD-10-CM | POA: Diagnosis not present

## 2022-10-27 NOTE — Progress Notes (Signed)
Contacted via MyChart   Normal mammogram, may repeat in one year:)

## 2022-11-08 ENCOUNTER — Ambulatory Visit (INDEPENDENT_AMBULATORY_CARE_PROVIDER_SITE_OTHER): Payer: Medicare Other

## 2022-11-08 VITALS — Ht 65.0 in | Wt 150.0 lb

## 2022-11-08 DIAGNOSIS — Z0000 Encounter for general adult medical examination without abnormal findings: Secondary | ICD-10-CM | POA: Diagnosis not present

## 2022-11-08 NOTE — Patient Instructions (Signed)
[name] , Thank you for taking time to come for your Medicare Wellness Visit. I appreciate your ongoing commitment to your health goals. Please review the following plan we discussed and let me know if I can assist you in the future.   These are the goals we discussed:  Goals      DIET - EAT MORE FRUITS AND VEGETABLES        This is a list of the screening recommended for you and due dates:  Health Maintenance  Topic Date Due   DTaP/Tdap/Td vaccine (1 - Tdap) Never done   Zoster (Shingles) Vaccine (1 of 2) Never done   COVID-19 Vaccine (4 - 2023-24 season) 05/28/2022   Medicare Annual Wellness Visit  11/09/2023   Cologuard (Stool DNA test)  05/18/2024   Mammogram  10/26/2024   DEXA scan (bone density measurement)  06/24/2026   Pneumonia Vaccine  Completed   Flu Shot  Completed   Hepatitis C Screening: USPSTF Recommendation to screen - Ages 18-79 yo.  Completed   HPV Vaccine  Aged Out    Advanced directives: no  Conditions/risks identified: none  Next appointment: Follow up in one year for your annual wellness visit 11/14/23 @ 1pm by phone   Preventive Care 65 Years and Older, Female Preventive care refers to lifestyle choices and visits with your health care provider that can promote health and wellness. What does preventive care include? A yearly physical exam. This is also called an annual well check. Dental exams once or twice a year. Routine eye exams. Ask your health care provider how often you should have your eyes checked. Personal lifestyle choices, including: Daily care of your teeth and gums. Regular physical activity. Eating a healthy diet. Avoiding tobacco and drug use. Limiting alcohol use. Practicing safe sex. Taking low-dose aspirin every day. Taking vitamin and mineral supplements as recommended by your health care provider. What happens during an annual well check? The services and screenings done by your health care provider during your annual well  check will depend on your age, overall health, lifestyle risk factors, and family history of disease. Counseling  Your health care provider may ask you questions about your: Alcohol use. Tobacco use. Drug use. Emotional well-being. Home and relationship well-being. Sexual activity. Eating habits. History of falls. Memory and ability to understand (cognition). Work and work Statistician. Reproductive health. Screening  You may have the following tests or measurements: Height, weight, and BMI. Blood pressure. Lipid and cholesterol levels. These may be checked every 5 years, or more frequently if you are over 52 years old. Skin check. Lung cancer screening. You may have this screening every year starting at age 66 if you have a 30-pack-year history of smoking and currently smoke or have quit within the past 15 years. Fecal occult blood test (FOBT) of the stool. You may have this test every year starting at age 66. Flexible sigmoidoscopy or colonoscopy. You may have a sigmoidoscopy every 5 years or a colonoscopy every 10 years starting at age 66. Hepatitis C blood test. Hepatitis B blood test. Sexually transmitted disease (STD) testing. Diabetes screening. This is done by checking your blood sugar (glucose) after you have not eaten for a while (fasting). You may have this done every 1-3 years. Bone density scan. This is done to screen for osteoporosis. You may have this done starting at age 66. Mammogram. This may be done every 1-2 years. Talk to your health care provider about how often you should have  regular mammograms. Talk with your health care provider about your test results, treatment options, and if necessary, the need for more tests. Vaccines  Your health care provider may recommend certain vaccines, such as: Influenza vaccine. This is recommended every year. Tetanus, diphtheria, and acellular pertussis (Tdap, Td) vaccine. You may need a Td booster every 10 years. Zoster  vaccine. You may need this after age 66. Pneumococcal 13-valent conjugate (PCV13) vaccine. One dose is recommended after age 64. Pneumococcal polysaccharide (PPSV23) vaccine. One dose is recommended after age 66. Talk to your health care provider about which screenings and vaccines you need and how often you need them. This information is not intended to replace advice given to you by your health care provider. Make sure you discuss any questions you have with your health care provider. Document Released: 10/10/2015 Document Revised: 06/02/2016 Document Reviewed: 07/15/2015 Elsevier Interactive Patient Education  2017 [address] Prevention in the Home Falls can cause injuries. They can happen to people of all ages. There are many things you can do to make your home safe and to help prevent falls. What can I do on the outside of my home? Regularly fix the edges of walkways and driveways and fix any cracks. Remove anything that might make you trip as you walk through a door, such as a raised step or threshold. Trim any bushes or trees on the path to your home. Use bright outdoor lighting. Clear any walking paths of anything that might make someone trip, such as rocks or tools. Regularly check to see if handrails are loose or broken. Make sure that both sides of any steps have handrails. Any raised decks and porches should have guardrails on the edges. Have any leaves, snow, or ice cleared regularly. Use sand or salt on walking paths during winter. Clean up any spills in your garage right away. This includes oil or grease spills. What can I do in the bathroom? Use night lights. Install grab bars by the toilet and in the tub and shower. Do not use towel bars as grab bars. Use non-skid mats or decals in the tub or shower. If you need to sit down in the shower, use a plastic, non-slip stool. Keep the floor dry. Clean up any water that spills on the floor as soon as it happens. Remove  soap buildup in the tub or shower regularly. Attach bath mats securely with double-sided non-slip rug tape. Do not have throw rugs and other things on the floor that can make you trip. What can I do in the bedroom? Use night lights. Make sure that you have a light by your bed that is easy to reach. Do not use any sheets or blankets that are too big for your bed. They should not hang down onto the floor. Have a firm chair that has side arms. You can use this for support while you get dressed. Do not have throw rugs and other things on the floor that can make you trip. What can I do in the kitchen? Clean up any spills right away. Avoid walking on wet floors. Keep items that you use a lot in easy-to-reach places. If you need to reach something above you, use a strong step stool that has a grab bar. Keep electrical cords out of the way. Do not use floor polish or wax that makes floors slippery. If you must use wax, use non-skid floor wax. Do not have throw rugs and other things on the floor that  can make you trip. What can I do with my stairs? Do not leave any items on the stairs. Make sure that there are handrails on both sides of the stairs and use them. Fix handrails that are broken or loose. Make sure that handrails are as long as the stairways. Check any carpeting to make sure that it is firmly attached to the stairs. Fix any carpet that is loose or worn. Avoid having throw rugs at the top or bottom of the stairs. If you do have throw rugs, attach them to the floor with carpet tape. Make sure that you have a light switch at the top of the stairs and the bottom of the stairs. If you do not have them, ask someone to add them for you. What else can I do to help prevent falls? Wear shoes that: Do not have high heels. Have rubber bottoms. Are comfortable and fit you well. Are closed at the toe. Do not wear sandals. If you use a stepladder: Make sure that it is fully opened. Do not climb a  closed stepladder. Make sure that both sides of the stepladder are locked into place. Ask someone to hold it for you, if possible. Clearly mark and make sure that you can see: Any grab bars or handrails. First and last steps. Where the edge of each step is. Use tools that help you move around (mobility aids) if they are needed. These include: Canes. Walkers. Scooters. Crutches. Turn on the lights when you go into a dark area. Replace any light bulbs as soon as they burn out. Set up your furniture so you have a clear path. Avoid moving your furniture around. If any of your floors are uneven, fix them. If there are any pets around you, be aware of where they are. Review your medicines with your doctor. Some medicines can make you feel dizzy. This can increase your chance of falling. Ask your doctor what other things that you can do to help prevent falls. This information is not intended to replace advice given to you by your health care provider. Make sure you discuss any questions you have with your health care provider. Document Released: 07/10/2009 Document Revised: 02/19/2016 Document Reviewed: 10/18/2014 Elsevier Interactive Patient Education  2017 [employer].

## 2022-11-08 NOTE — Progress Notes (Signed)
I connected with  Milbert Coulter on 11/08/22 by a audio enabled telemedicine application and verified that I am speaking with the correct person using two identifiers.  Patient Location: Home  Provider Location: Home Office  I discussed the limitations of evaluation and management by telemedicine. The patient expressed understanding and agreed to proceed.  Subjective:   Christina Manning is a 67 y.o. female who presents for Medicare Annual (Subsequent) preventive examination.  Review of Systems     Cardiac Risk Factors include: advanced age (>58mn, >>50women)     Objective:    There were no vitals filed for this visit. There is no height or weight on file to calculate BMI.     11/08/2022    3:06 PM  Advanced Directives  Does Patient Have a Medical Advance Directive? No  Would patient like information on creating a medical advance directive? No - Patient declined    Current Medications (verified) Outpatient Encounter Medications as of 11/08/2022  Medication Sig   ascorbic acid (VITAMIN C) 500 MG tablet Take by mouth.   Coenzyme Q10 10 MG capsule Take by mouth.   glucosamine-chondroitin 500-400 MG tablet Take by mouth.   Magnesium Gluconate 550 MG TABS Take by mouth.   Multiple Vitamin (MULTI-VITAMINS) TABS Take 1 tablet by mouth daily.   Omega-3 1000 MG CAPS Take by mouth.   Turmeric, Curcuma Longa, (CURCUMIN) POWD 2 capsules by Miscellaneous route Two (2) times a day.   Zinc 50 MG TABS Take 50 mg by mouth daily.   No facility-administered encounter medications on file as of 11/08/2022.    Allergies (verified) Sulfa antibiotics   History: Past Medical History:  Diagnosis Date   Actinic keratosis 10/20/2021   L lat calf   Ectopic pregnancy, tubal    Fibroids    Past Surgical History:  Procedure Laterality Date   ABDOMINAL HYSTERECTOMY     DILATION AND CURETTAGE OF UTERUS     Fallopian Tube Surgery     OOPHORECTOMY     WISDOM TOOTH EXTRACTION     Family  History  Problem Relation Age of Onset   Mental illness Mother    Non-Hodgkin's lymphoma Mother    Brain cancer Mother    Lupus Father    Heart Problems Daughter    Healthy Brother    Healthy Son    Healthy Son    Breast cancer Neg Hx    Social History   Socioeconomic History   Marital status: Married    Spouse name: Not on file   Number of children: 3   Years of education: Not on file   Highest education level: Not on file  Occupational History   Not on file  Tobacco Use   Smoking status: Never   Smokeless tobacco: Never  Substance and Sexual Activity   Alcohol use: Never   Drug use: Not Currently   Sexual activity: Not Currently  Other Topics Concern   Not on file  Social History Narrative   Not on file   Social Determinants of Health   Financial Resource Strain: Low Risk  (11/08/2022)   Overall Financial Resource Strain (CARDIA)    Difficulty of Paying Living Expenses: Not hard at all  Food Insecurity: No Food Insecurity (11/08/2022)   Hunger Vital Sign    Worried About Running Out of Food in the Last Year: Never true    Ran Out of Food in the Last Year: Never true  Transportation Needs: No Transportation Needs (11/08/2022)  PRAPARE - Hydrologist (Medical): No    Lack of Transportation (Non-Medical): No  Physical Activity: Sufficiently Active (11/08/2022)   Exercise Vital Sign    Days of Exercise per Week: 7 days    Minutes of Exercise per Session: 60 min  Stress: No Stress Concern Present (11/08/2022)   [place]    Feeling of Stress : Not at all  Social Connections: Socially Isolated (11/08/2022)   Social Connection and Isolation Panel [NHANES]    Frequency of Communication with Friends and Family: Never    Frequency of Social Gatherings with Friends and Family: Never    Attends Religious Services: Never    Marine scientist or Organizations: No    Attends  Music therapist: Never    Marital Status: Married    Tobacco Counseling Counseling given: Not Answered   Clinical Intake:  Pre-visit preparation completed: Yes  Pain : No/denies pain     Nutritional Risks: None Diabetes: No  How often do you need to have someone help you when you read instructions, pamphlets, or other written materials from your doctor or pharmacy?: 1 - Never  Diabetic?no  Interpreter Needed?: No  Information entered by :: Kirke Shaggy, LPN   Activities of Daily Living    11/08/2022    3:06 PM 06/14/2022   11:03 AM  In your present state of health, do you have any difficulty performing the following activities:  Hearing? 0 0  Vision? 0 0  Difficulty concentrating or making decisions? 0 0  Walking or climbing stairs? 0 0  Dressing or bathing? 0 0  Doing errands, shopping? 0 0  Preparing Food and eating ? N   Using the Toilet? N   In the past six months, have you accidently leaked urine? N   Do you have problems with loss of bowel control? N   Managing your Medications? N   Managing your Finances? N   Housekeeping or managing your Housekeeping? N     Patient Care Team: Venita Lick, NP as PCP - General (Nurse Practitioner)  Indicate any recent Medical Services you may have received from other than [facility] providers in the past year (date may be approximate).     Assessment:   This is a routine wellness examination for Christina Manning.  Hearing/Vision screen Hearing Screening - Comments:: No aids Vision Screening - Comments:: No glasses  Dietary issues and exercise activities discussed: Current Exercise Habits: Home exercise routine, Type of exercise: walking;Other - see comments (has a farm w/ animals), Time (Minutes): 60, Frequency (Times/Week): 7, Weekly Exercise (Minutes/Week): 420, Intensity: Moderate   Goals Addressed             This Visit's Progress    DIET - EAT MORE FRUITS AND VEGETABLES         Depression  Screen    11/08/2022    3:04 PM 08/25/2022    9:39 AM 06/14/2022   10:38 AM 09/14/2021    2:31 PM 06/10/2021   10:40 AM 05/08/2021   11:11 AM  PHQ 2/9 Scores  PHQ - 2 Score 0 0 0 1 0 1  PHQ- 9 Score 0 0 1 2      Fall Risk    11/08/2022    3:06 PM 08/25/2022    9:39 AM 06/14/2022   10:38 AM 06/10/2021   10:40 AM 06/10/2021   10:07 AM  Fall Risk   Falls  in the past year? 0 0 0 0 0  Number falls in past yr: 0 0 0 0 0  Injury with Fall? 0 0 0 0 0  Risk for fall due to : No Fall Risks No Fall Risks No Fall Risks No Fall Risks No Fall Risks  Follow up Falls prevention discussed;Falls evaluation completed Falls evaluation completed Falls evaluation completed Falls prevention discussed Falls evaluation completed    FALL RISK PREVENTION PERTAINING TO THE HOME:  Any stairs in or around the home? Yes  If so, are there any without handrails? No  Home free of loose throw rugs in walkways, pet beds, electrical cords, etc? Yes  Adequate lighting in your home to reduce risk of falls? Yes   ASSISTIVE DEVICES UTILIZED TO PREVENT FALLS:  Life alert? No  Use of a cane, walker or w/c? No  Grab bars in the bathroom? No  Shower chair or bench in shower? No  Elevated toilet seat or a handicapped toilet? No   Cognitive Function:        11/08/2022    3:07 PM  6CIT Screen  What Year? 0 points  What month? 0 points  What time? 0 points  Count back from 20 0 points  Months in reverse 0 points  Repeat phrase 0 points  Total Score 0 points    Immunizations Immunization History  Administered Date(s) Administered   Influenza-Unspecified 07/03/2015, 07/02/2021, 06/08/2022   Janssen (J&J) SARS-COV-2 Vaccination 01/03/2020, 08/27/2020   PFIZER(Purple Top)SARS-COV-2 Vaccination 06/25/2021   PNEUMOCOCCAL CONJUGATE-20 06/14/2022    TDAP status: Due, Education has been provided regarding the importance of this vaccine. Advised may receive this vaccine at local pharmacy or Health Dept. Aware to  provide a copy of the vaccination record if obtained from local pharmacy or Health Dept. Verbalized acceptance and understanding.  Flu Vaccine status: Up to date  Pneumococcal vaccine status: Up to date  Covid-19 vaccine status: Completed vaccines  Qualifies for Shingles Vaccine? Yes   Zostavax completed No   Shingrix Completed?: No.    Education has been provided regarding the importance of this vaccine. Patient has been advised to call insurance company to determine out of pocket expense if they have not yet received this vaccine. Advised may also receive vaccine at local pharmacy or Health Dept. Verbalized acceptance and understanding.  Screening Tests Health Maintenance  Topic Date Due   DTaP/Tdap/Td (1 - Tdap) Never done   Zoster Vaccines- Shingrix (1 of 2) Never done   COVID-19 Vaccine (4 - 2023-24 season) 05/28/2022   Medicare Annual Wellness (AWV)  11/09/2023   Fecal DNA (Cologuard)  05/18/2024   MAMMOGRAM  10/26/2024   DEXA SCAN  06/24/2026   Pneumonia Vaccine 63+ Years old  Completed   INFLUENZA VACCINE  Completed   Hepatitis C Screening  Completed   HPV VACCINES  Aged Out    Health Maintenance  Health Maintenance Due  Topic Date Due   DTaP/Tdap/Td (1 - Tdap) Never done   Zoster Vaccines- Shingrix (1 of 2) Never done   COVID-19 Vaccine (4 - 2023-24 season) 05/28/2022    Colorectal cancer screening: Type of screening: Cologuard. Completed 05/18/21. Repeat every 3 years  Mammogram status: Completed 07/27/23. Repeat every year  Bone Density status: Ordered 06/14/22. Pt provided with contact info and advised to call to schedule appt.  Lung Cancer Screening: (Low Dose CT Chest recommended if Age 70-80 years, 30 pack-year currently smoking OR have quit w/in 15years.) does not qualify.  Additional Screening:  Hepatitis C Screening: does qualify; Completed 01/31/17  Vision Screening: Recommended annual ophthalmology exams for early detection of glaucoma and other  disorders of the eye. Is the patient up to date with their annual eye exam?  No  Who is the provider or what is the name of the office in which the patient attends annual eye exams? No one If pt is not established with a provider, would they like to be referred to a provider to establish care? No .   Dental Screening: Recommended annual dental exams for proper oral hygiene  Community Resource Referral / Chronic Care Management: CRR required this visit?  No   CCM required this visit?  No      Plan:     I have personally reviewed and noted the following in the patient's chart:   Medical and social history Use of alcohol, tobacco or illicit drugs  Current medications and supplements including opioid prescriptions. Patient is not currently taking opioid prescriptions. Functional ability and status Nutritional status Physical activity Advanced directives List of other physicians Hospitalizations, surgeries, and ER visits in previous 12 months Vitals Screenings to include cognitive, depression, and falls Referrals and appointments  In addition, I have reviewed and discussed with patient certain preventive protocols, quality metrics, and best practice recommendations. A written personalized care plan for preventive services as well as general preventive health recommendations were provided to patient.     Dionisio David, LPN   [id number]   Nurse Notes: none

## 2023-06-12 NOTE — Patient Instructions (Signed)
Be Involved in Caring For Your Health:  Taking Medications When medications are taken as directed, they can greatly improve your health. But if they are not taken as prescribed, they may not work. In some cases, not taking them correctly can be harmful. To help ensure your treatment remains effective and safe, understand your medications and how to take them. Bring your medications to each visit for review by your provider.  Your lab results, notes, and after visit summary will be available on My Chart. We strongly encourage you to use this feature. If lab results are abnormal the clinic will contact you with the appropriate steps. If the clinic does not contact you assume the results are satisfactory. You can always view your results on My Chart. If you have questions regarding your health or results, please contact the clinic during office hours. You can also ask questions on My Chart.  We at [facility] are grateful that you chose [place] to provide your care. We strive to provide evidence-based and compassionate care and are always looking for feedback. If you get a survey from the clinic please complete this so we can hear your opinions.  Healthy Eating, Adult Healthy eating may help you get and keep a healthy body weight, reduce the risk of chronic disease, and live a long and productive life. It is important to follow a healthy eating pattern. Your nutritional and calorie needs should be met mainly by different nutrient-rich foods. What are tips for following this plan? Reading food labels Read labels and choose the following: Reduced or low sodium products. Juices with 100% fruit juice. Foods with low saturated fats (<3 g per serving) and high polyunsaturated and monounsaturated fats. Foods with whole grains, such as whole wheat, cracked wheat, brown rice, and wild rice. Whole grains that are fortified with folic acid. This is recommended for females who are pregnant or who want to  become pregnant. Read labels and do not eat or drink the following: Foods or drinks with added sugars. These include foods that contain brown sugar, corn sweetener, corn syrup, dextrose, fructose, glucose, high-fructose corn syrup, honey, invert sugar, lactose, malt syrup, maltose, molasses, raw sugar, sucrose, trehalose, or turbinado sugar. Limit your intake of added sugars to less than 10% of your total daily calories. Do not eat more than the following amounts of added sugar per day: 6 teaspoons (25 g) for females. 9 teaspoons (38 g) for males. Foods that contain processed or refined starches and grains. Refined grain products, such as white flour, degermed cornmeal, white bread, and white rice. Shopping Choose nutrient-rich snacks, such as vegetables, whole fruits, and nuts. Avoid high-calorie and high-sugar snacks, such as potato chips, fruit snacks, and candy. Use oil-based dressings and spreads on foods instead of solid fats such as butter, margarine, sour cream, or cream cheese. Limit pre-made sauces, mixes, and "instant" products such as flavored rice, instant noodles, and ready-made pasta. Try more plant-protein sources, such as tofu, tempeh, black beans, edamame, lentils, nuts, and seeds. Explore eating plans such as the Mediterranean diet or vegetarian diet. Try heart-healthy dips made with beans and healthy fats like hummus and guacamole. Vegetables go great with these. Cooking Use oil to saut or stir-fry foods instead of solid fats such as butter, margarine, or lard. Try baking, boiling, grilling, or broiling instead of frying. Remove the fatty part of meats before cooking. Steam vegetables in water or broth. Meal planning  At meals, imagine dividing your plate into fourths: One-half of  your plate is fruits and vegetables. One-fourth of your plate is whole grains. One-fourth of your plate is protein, especially lean meats, poultry, eggs, tofu, beans, or nuts. Include low-fat  dairy as part of your daily diet. Lifestyle Choose healthy options in all settings, including home, work, school, restaurants, or stores. Prepare your food safely: Wash your hands after handling raw meats. Where you prepare food, keep surfaces clean by regularly washing with hot, soapy water. Keep raw meats separate from ready-to-eat foods, such as fruits and vegetables. Cook seafood, meat, poultry, and eggs to the recommended temperature. Get a food thermometer. Store foods at safe temperatures. In general: Keep cold foods at 76F (4.4C) or below. Keep hot foods at 176F (60C) or above. Keep your freezer at [facility] (-17.8C) or below. Foods are not safe to eat if they have been between the temperatures of 40-176F (4.4-60C) for more than 2 hours. What foods should I eat? Fruits Aim to eat 1-2 cups of fresh, canned (in natural juice), or frozen fruits each day. One cup of fruit equals 1 small apple, 1 large banana, 8 large strawberries, 1 cup (237 g) canned fruit,  cup (82 g) dried fruit, or 1 cup (240 mL) 100% juice. Vegetables Aim to eat 2-4 cups of fresh and frozen vegetables each day, including different varieties and colors. One cup of vegetables equals 1 cup (91 g) broccoli or cauliflower florets, 2 medium carrots, 2 cups (150 g) raw, leafy greens, 1 large tomato, 1 large bell pepper, 1 large sweet potato, or 1 medium white potato. Grains Aim to eat 5-10 ounce-equivalents of whole grains each day. Examples of 1 ounce-equivalent of grains include 1 slice of bread, 1 cup (40 g) ready-to-eat cereal, 3 cups (24 g) popcorn, or  cup (93 g) cooked rice. Meats and other proteins Try to eat 5-7 ounce-equivalents of protein each day. Examples of 1 ounce-equivalent of protein include 1 egg,  oz nuts (12 almonds, 24 pistachios, or 7 walnut halves), 1/4 cup (90 g) cooked beans, 6 tablespoons (90 g) hummus or 1 tablespoon (16 g) peanut butter. A cut of meat or fish that is the size of a deck of  cards is about 3-4 ounce-equivalents (85 g). Of the protein you eat each week, try to have at least 8 sounce (227 g) of seafood. This is about 2 servings per week. This includes salmon, trout, herring, sardines, and anchovies. Dairy Aim to eat 3 cup-equivalents of fat-free or low-fat dairy each day. Examples of 1 cup-equivalent of dairy include 1 cup (240 mL) milk, 8 ounces (250 g) yogurt, 1 ounces (44 g) natural cheese, or 1 cup (240 mL) fortified soy milk. Fats and oils Aim for about 5 teaspoons (21 g) of fats and oils per day. Choose monounsaturated fats, such as canola and olive oils, mayonnaise made with olive oil or avocado oil, avocados, peanut butter, and most nuts, or polyunsaturated fats, such as sunflower, corn, and soybean oils, walnuts, pine nuts, sesame seeds, sunflower seeds, and flaxseed. Beverages Aim for 6 eight-ounce glasses of water per day. Limit coffee to 3-5 eight-ounce cups per day. Limit caffeinated beverages that have added calories, such as soda and energy drinks. If you drink alcohol: Limit how much you have to: 0-1 drink a day if you are female. 0-2 drinks a day if you are female. Know how much alcohol is in your drink. In the [place] one drink is one 12 oz bottle of beer (355 mL), one 5 oz glass of wine (  148 mL), or one 1 oz glass of hard liquor (44 mL). Seasoning and other foods Try not to add too much salt to your food. Try using herbs and spices instead of salt. Try not to add sugar to food. This information is based on [place] nutrition guidelines. To learn more, visit [employer]posableN[employer].be. Exact amounts may vary. You may need different amounts. This information is not intended to replace advice given to you by your health care provider. Make sure you discuss any questions you have with your health care provider. Document Revised: 06/14/2022 Document Reviewed: 06/14/2022 Elsevier Patient Education  2024 [employer].

## 2023-06-16 ENCOUNTER — Encounter: Payer: Self-pay | Admitting: Nurse Practitioner

## 2023-06-16 ENCOUNTER — Ambulatory Visit (INDEPENDENT_AMBULATORY_CARE_PROVIDER_SITE_OTHER): Payer: Medicare Other | Admitting: Nurse Practitioner

## 2023-06-16 VITALS — BP 122/80 | HR 88 | Temp 97.9°F | Ht 64.0 in | Wt 148.0 lb

## 2023-06-16 DIAGNOSIS — Z23 Encounter for immunization: Secondary | ICD-10-CM

## 2023-06-16 DIAGNOSIS — Z0000 Encounter for general adult medical examination without abnormal findings: Secondary | ICD-10-CM

## 2023-06-16 DIAGNOSIS — E7800 Pure hypercholesterolemia, unspecified: Secondary | ICD-10-CM

## 2023-06-16 DIAGNOSIS — M85832 Other specified disorders of bone density and structure, left forearm: Secondary | ICD-10-CM | POA: Diagnosis not present

## 2023-06-16 DIAGNOSIS — R030 Elevated blood-pressure reading, without diagnosis of hypertension: Secondary | ICD-10-CM

## 2023-06-16 NOTE — Assessment & Plan Note (Signed)
Noted on DEXA imaging, she wishes to repeat this year if possible -- discussed this may not be covered and she will check on this.  Continue Vitamin D3 and calcium rich diet at home.

## 2023-06-16 NOTE — Assessment & Plan Note (Signed)
Noted on past labs from 2018, recommend diet focus at home and regular activity.  Lipid panel today. ASCVD 6.5%.

## 2023-06-16 NOTE — Progress Notes (Signed)
BP 122/80 (BP Location: Left Arm, Patient Position: Sitting, Cuff Size: Normal)   Pulse 88   Temp 97.9 F (36.6 C) (Oral)   Ht 5\' 4"  (1.626 m)   Wt 148 lb (67.1 kg)   SpO2 97%   BMI 25.40 kg/m    Subjective:    Patient ID: Christina Manning, female    DOB: 12-22-1955, 67 y.o.   MRN: [id number]  HPI: Christina Manning is a 67 y.o. female presenting on 06/16/2023 for comprehensive medical examination. 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  BP checks at home average (checks routinely at home) = 108/51 to 122/63 -- average 100 -120/65-72.  Is taking supplements, prefers not to start medication. The 10-year ASCVD risk score (Arnett DK, et al., 2019) is: 6.5%   Values used to calculate the score:     Age: 69 years     Sex: Female     Is Non-Hispanic African American: No     Diabetic: No     Tobacco smoker: No     Systolic Blood Pressure: 122 mmHg     Is BP treated: No     HDL Cholesterol: 48 mg/dL     Total Cholesterol: 243 mg/dL  Depression Screen done today and results listed below:     06/16/2023   10:29 AM 11/08/2022    3:04 PM 08/25/2022    9:39 AM 06/14/2022   10:38 AM 09/14/2021    2:31 PM  Depression screen PHQ 2/9  Decreased Interest 0 0 0 0 0  Down, Depressed, Hopeless 0 0 0 0 1  PHQ - 2 Score 0 0 0 0 1  Altered sleeping 0 0 0 0 0  Tired, decreased energy 0 0 0 1 1  Change in appetite 0 0 0 0 0  Feeling bad or failure about yourself  0 0 0 0 0  Trouble concentrating 0 0 0 0 0  Moving slowly or fidgety/restless 0 0 0 0 0  Suicidal thoughts 0 0 0 0 0  PHQ-9 Score 0 0 0 1 2  Difficult doing work/chores Not difficult at all Not difficult at all Not difficult at all Not difficult at all Not difficult at all      06/16/2023   10:29 AM 08/25/2022    9:39 AM 06/14/2022   10:38 AM 09/14/2021    2:32 PM  GAD 7 : Generalized Anxiety Score   Nervous, Anxious, on Edge 0 0 0 0  Control/stop worrying 0 0 0 0  Worry too much - different things 0 0 0 0  Trouble relaxing 0 0 0 0  Restless 0 0 0 0  Easily annoyed or irritable 0 0 0 0  Afraid - awful might happen 0 0 0 0  Total GAD 7 Score 0 0 0 0  Anxiety Difficulty Not difficult at all Not difficult at all Not difficult at all Not difficult at all      06/16/2023   10:29 AM 11/08/2022    3:06 PM 08/25/2022    9:39 AM 06/14/2022   10:38 AM 06/10/2021   10:40 AM  Fall Risk   Falls in the past year? 0 0 0 0 0  Number falls in past yr: 0 0 0 0 0  Injury with Fall? 0 0 0 0 0  Risk for fall due to : No Fall Risks No Fall  Risks No Fall Risks No Fall Risks No Fall Risks  Follow up Falls evaluation completed Falls prevention discussed;Falls evaluation completed Falls evaluation completed Falls evaluation completed Falls prevention discussed    Functional Status Survey: Is the patient deaf or have difficulty hearing?: No Does the patient have difficulty seeing, even when wearing glasses/contacts?: No Does the patient have difficulty concentrating, remembering, or making decisions?: No Does the patient have difficulty walking or climbing stairs?: No Does the patient have difficulty dressing or bathing?: No Does the patient have difficulty doing errands alone such as visiting a doctor's office or shopping?: No   Past Medical History:  Past Medical History:  Diagnosis Date   Actinic keratosis 10/20/2021   L lat calf   Ectopic pregnancy, tubal    Fibroids     Surgical History:  Past Surgical History:  Procedure Laterality Date   ABDOMINAL HYSTERECTOMY     DILATION AND CURETTAGE OF UTERUS     Fallopian Tube Surgery     OOPHORECTOMY     WISDOM TOOTH EXTRACTION      Medications:  Current Outpatient Medications on File Prior to Visit  Medication Sig   ascorbic acid (VITAMIN C) 500 MG tablet Take by mouth.   Coenzyme Q10 10 MG capsule Take by mouth.   glucosamine-chondroitin  500-400 MG tablet Take by mouth.   Magnesium Gluconate 550 MG TABS Take by mouth.   Multiple Vitamin (MULTI-VITAMINS) TABS Take 1 tablet by mouth daily.   Omega-3 1000 MG CAPS Take by mouth.   Zinc 50 MG TABS Take 50 mg by mouth daily.   No current facility-administered medications on file prior to visit.    Allergies:  Allergies  Allergen Reactions   Sulfa Antibiotics Swelling    Social History:  Social History   Socioeconomic History   Marital status: Married    Spouse name: Not on file   Number of children: 3   Years of education: Not on file   Highest education level: Not on file  Occupational History   Not on file  Tobacco Use   Smoking status: Never   Smokeless tobacco: Never  Vaping Use   Vaping status: Never Used  Substance and Sexual Activity   Alcohol use: Never   Drug use: Not Currently   Sexual activity: Not Currently  Other Topics Concern   Not on file  Social History Narrative   Not on file   Social Determinants of Health   Financial Resource Strain: Low Risk  (11/08/2022)   Overall Financial Resource Strain (CARDIA)    Difficulty of Paying Living Expenses: Not hard at all  Food Insecurity: No Food Insecurity (11/08/2022)   Hunger Vital Sign    Worried About Running Out of Food in the Last Year: Never true    Ran Out of Food in the Last Year: Never true  Transportation Needs: No Transportation Needs (11/08/2022)   PRAPARE - Administrator, Civil Service (Medical): No    Lack of Transportation (Non-Medical): No  Physical Activity: Sufficiently Active (11/08/2022)   Exercise Vital Sign    Days of Exercise per Week: 7 days    Minutes of Exercise per Session: 60 min  Stress: No Stress Concern Present (11/08/2022)   [employer] of Occupational Health - Occupational Stress Questionnaire    Feeling of Stress : Not at all  Social Connections: Socially Isolated (11/08/2022)   Social Connection and Isolation Panel [NHANES]    Frequency of  Communication with Friends and  Family: Never    Frequency of Social Gatherings with Friends and Family: Never    Attends Religious Services: Never    Database administrator or Organizations: No    Attends Banker Meetings: Never    Marital Status: Married  Catering manager Violence: Not At Risk (11/08/2022)   Humiliation, Afraid, Rape, and Kick questionnaire    Fear of Current or Ex-Partner: No    Emotionally Abused: No    Physically Abused: No    Sexually Abused: No   Social History   Tobacco Use  Smoking Status Never  Smokeless Tobacco Never   Social History   Substance and Sexual Activity  Alcohol Use Never    Family History:  Family History  Problem Relation Age of Onset   Mental illness Mother    Non-Hodgkin's lymphoma Mother    Brain cancer Mother    Lupus Father    Heart Problems Daughter    Healthy Brother    Healthy Son    Healthy Son    Breast cancer Neg Hx     Past medical history, surgical history, medications, allergies, family history and social history reviewed with patient today and changes made to appropriate areas of the chart.   ROS All other ROS negative except what is listed above and in the HPI.      Objective:    BP 122/80 (BP Location: Left Arm, Patient Position: Sitting, Cuff Size: Normal)   Pulse 88   Temp 97.9 F (36.6 C) (Oral)   Ht 5\' 4"  (1.626 m)   Wt 148 lb (67.1 kg)   SpO2 97%   BMI 25.40 kg/m   Wt Readings from Last 3 Encounters:  06/16/23 148 lb (67.1 kg)  11/08/22 150 lb (68 kg)  08/25/22 150 lb 14.4 oz (68.4 kg)    Physical Exam Vitals and nursing note reviewed.  Constitutional:      General: She is awake. She is not in acute distress.    Appearance: She is well-developed and well-groomed. She is not ill-appearing or toxic-appearing.  HENT:     Head: Normocephalic and atraumatic.     Right Ear: Hearing, tympanic membrane, ear canal and external ear normal. No drainage.     Left Ear: Hearing, tympanic  membrane, ear canal and external ear normal. No drainage.     Nose: Nose normal.     Right Sinus: No maxillary sinus tenderness or frontal sinus tenderness.     Left Sinus: No maxillary sinus tenderness or frontal sinus tenderness.     Mouth/Throat:     Mouth: Mucous membranes are moist.     Pharynx: Oropharynx is clear. Uvula midline. No pharyngeal swelling, oropharyngeal exudate or posterior oropharyngeal erythema.  Eyes:     General: Lids are normal.        Right eye: No discharge.        Left eye: No discharge.     Extraocular Movements: Extraocular movements intact.     Conjunctiva/sclera: Conjunctivae normal.     Pupils: Pupils are equal, round, and reactive to light.     Visual Fields: Right eye visual fields normal and left eye visual fields normal.  Neck:     Thyroid: No thyromegaly.     Vascular: No carotid bruit.     Trachea: Trachea normal.  Cardiovascular:     Rate and Rhythm: Normal rate and regular rhythm.     Heart sounds: Normal heart sounds. No murmur heard.    No gallop.  Pulmonary:     Effort: Pulmonary effort is normal. No accessory muscle usage or respiratory distress.     Breath sounds: Normal breath sounds.  Abdominal:     General: Bowel sounds are normal.     Palpations: Abdomen is soft. There is no hepatomegaly or splenomegaly.     Tenderness: There is no abdominal tenderness.  Musculoskeletal:        General: Normal range of motion.     Cervical back: Normal range of motion and neck supple.     Right lower leg: No edema.     Left lower leg: No edema.  Lymphadenopathy:     Head:     Right side of head: No submental, submandibular, tonsillar, preauricular or posterior auricular adenopathy.     Left side of head: No submental, submandibular, tonsillar, preauricular or posterior auricular adenopathy.     Cervical: No cervical adenopathy.  Skin:    General: Skin is warm and dry.     Capillary Refill: Capillary refill takes less than 2 seconds.      Findings: No rash.  Neurological:     Mental Status: She is alert and oriented to person, place, and time.     Gait: Gait is intact.     Deep Tendon Reflexes: Reflexes are normal and symmetric.     Reflex Scores:      Brachioradialis reflexes are 2+ on the right side and 2+ on the left side.      Patellar reflexes are 2+ on the right side and 2+ on the left side. Psychiatric:        Attention and Perception: Attention normal.        Mood and Affect: Mood normal.        Speech: Speech normal.        Behavior: Behavior normal. Behavior is cooperative.        Thought Content: Thought content normal.        Judgment: Judgment normal.    Results for orders placed or performed in visit on 06/14/22  CBC with Differential/Platelet  Result Value Ref Range   WBC 5.1 3.4 - 10.8 x10E3/uL   RBC 4.45 3.77 - 5.28 x10E6/uL   Hemoglobin 14.1 11.1 - 15.9 g/dL   Hematocrit 40.9 81.1 - 46.6 %   MCV 94 79 - 97 fL   MCH 31.7 26.6 - 33.0 pg   MCHC 33.9 31.5 - 35.7 g/dL   RDW 91.4 (L) 78.2 - 95.6 %   Platelets 223 150 - 450 x10E3/uL   Neutrophils 60 Not Estab. %   Lymphs 24 Not Estab. %   Monocytes 12 Not Estab. %   Eos 3 Not Estab. %   Basos 1 Not Estab. %   Neutrophils Absolute 3.1 1.4 - 7.0 x10E3/uL   Lymphocytes Absolute 1.2 0.7 - 3.1 x10E3/uL   Monocytes Absolute 0.6 0.1 - 0.9 x10E3/uL   EOS (ABSOLUTE) 0.1 0.0 - 0.4 x10E3/uL   Basophils Absolute 0.1 0.0 - 0.2 x10E3/uL   Immature Granulocytes 0 Not Estab. %   Immature Grans (Abs) 0.0 0.0 - 0.1 x10E3/uL  Comprehensive metabolic panel  Result Value Ref Range   Glucose 92 70 - 99 mg/dL   BUN 17 8 - 27 mg/dL   Creatinine, Ser 2.13 0.57 - 1.00 mg/dL   eGFR 84 >08 MV/HQI/6.96   BUN/Creatinine Ratio 22 12 - 28   Sodium 144 134 - 144 mmol/L   Potassium 4.4 3.5 - 5.2 mmol/L   Chloride 103  96 - 106 mmol/L   CO2 26 20 - 29 mmol/L   Calcium 10.0 8.7 - 10.3 mg/dL   Total Protein 6.9 6.0 - 8.5 g/dL   Albumin 5.0 (H) 3.9 - 4.9 g/dL   Globulin,  Total 1.9 1.5 - 4.5 g/dL   Albumin/Globulin Ratio 2.6 (H) 1.2 - 2.2   Bilirubin Total 0.7 0.0 - 1.2 mg/dL   Alkaline Phosphatase 64 44 - 121 IU/L   AST 24 0 - 40 IU/L   ALT 25 0 - 32 IU/L  Lipid Panel w/o Chol/HDL Ratio  Result Value Ref Range   Cholesterol, Total 243 (H) 100 - 199 mg/dL   Triglycerides 952 (H) 0 - 149 mg/dL   HDL 48 >84 mg/dL   VLDL Cholesterol Cal 35 5 - 40 mg/dL   LDL Chol Calc (NIH) 132 (H) 0 - 99 mg/dL  TSH  Result Value Ref Range   TSH 3.280 0.450 - 4.500 uIU/mL  VITAMIN D 25 Hydroxy (Vit-D Deficiency, Fractures)  Result Value Ref Range   Vit D, 25-Hydroxy 79.7 30.0 - 100.0 ng/mL      Assessment & Plan:   Problem List Items Addressed This Visit       Cardiovascular and Mediastinum   White coat syndrome without diagnosis of hypertension - Primary    Chronic, ongoing issue for years even at dentist, she checks BP at home and is often <130/80.  Stable on recheck today once she had relaxed.  Recommend she continue to monitor at home and focus on DASH diet.  LABS today: CBC, CMP, TSH, lipid.  Initiate medication as needed.      Relevant Orders   CBC with Differential/Platelet   Comprehensive metabolic panel   TSH     Musculoskeletal and Integument   Osteopenia of left forearm    Noted on DEXA imaging, she wishes to repeat this year if possible -- discussed this may not be covered and she will check on this.  Continue Vitamin D3 and calcium rich diet at home.        Relevant Orders   VITAMIN D 25 Hydroxy (Vit-D Deficiency, Fractures)     Other   Elevated low density lipoprotein (LDL) cholesterol level    Noted on past labs from 2018, recommend diet focus at home and regular activity.  Lipid panel today. ASCVD 6.5%.      Relevant Orders   Comprehensive metabolic panel   Lipid Panel w/o Chol/HDL Ratio   Other Visit Diagnoses     Flu vaccine need       Flu vaccine due and provided in office today, educated patient and consent obtained.   Relevant  Orders   Flu Vaccine Trivalent High Dose (Fluad) (Completed)   Encounter for annual physical exam       Annual physical today and health maintenance reviewed with patient.        Follow up plan: Return in about 1 year (around 06/15/2024) for Annual Exam.   LABORATORY TESTING:  - Pap smear: not applicable  IMMUNIZATIONS:   - Tdap: Tetanus vaccination status reviewed: will obtain outpatient - Influenza: Up To Date - Pneumovax: Up To Date - Prevnar: Not applicable - HPV: Not applicable - Zostavax vaccine: will obtain at pharmacy  SCREENING: -Mammogram: Up to date -- 10/26/22 - Colonoscopy: Up to date -- cologuard negative-- 05/18/24 next - Bone Density: Up to date -- osteopenia -- repeat 06/24/2026 -Hearing Test: Not applicable  -Spirometry: Not applicable   PATIENT COUNSELING:  Advised to take 1 mg of folate supplement per day if capable of pregnancy.   Sexuality: Discussed sexually transmitted diseases, partner selection, use of condoms, avoidance of unintended pregnancy  and contraceptive alternatives.   Advised to avoid cigarette smoking.  I discussed with the patient that most people either abstain from alcohol or drink within safe limits (<=14/week and <=4 drinks/occasion for males, <=7/weeks and <= 3 drinks/occasion for females) and that the risk for alcohol disorders and other health effects rises proportionally with the number of drinks per week and how often a drinker exceeds daily limits.  Discussed cessation/primary prevention of drug use and availability of treatment for abuse.   Diet: Encouraged to adjust caloric intake to maintain  or achieve ideal body weight, to reduce intake of dietary saturated fat and total fat, to limit sodium intake by avoiding high sodium foods and not adding table salt, and to maintain adequate dietary potassium and calcium preferably from fresh fruits, vegetables, and low-fat dairy products.    Stressed the importance of regular  exercise  Injury prevention: Discussed safety belts, safety helmets, smoke detector, smoking near bedding or upholstery.   Dental health: Discussed importance of regular tooth brushing, flossing, and dental visits.    NEXT PREVENTATIVE PHYSICAL DUE IN 1 YEAR. Return in about 1 year (around 06/15/2024) for Annual Exam.

## 2023-06-16 NOTE — Assessment & Plan Note (Signed)
Chronic, ongoing issue for years even at dentist, she checks BP at home and is often <130/80.  Stable on recheck today once she had relaxed.  Recommend she continue to monitor at home and focus on DASH diet.  LABS today: CBC, CMP, TSH, lipid.  Initiate medication as needed.

## 2023-06-17 ENCOUNTER — Encounter: Payer: Self-pay | Admitting: Nurse Practitioner

## 2023-06-17 LAB — LIPID PANEL W/O CHOL/HDL RATIO
Cholesterol, Total: 241 mg/dL — ABNORMAL HIGH (ref 100–199)
HDL: 43 mg/dL (ref >39)
LDL Chol Calc (NIH): 153 mg/dL — ABNORMAL HIGH (ref 0–99)
Triglycerides: 247 mg/dL — ABNORMAL HIGH (ref 0–149)
VLDL Cholesterol Cal: 45 mg/dL — ABNORMAL HIGH (ref 5–40)

## 2023-06-17 LAB — CBC WITH DIFFERENTIAL/PLATELET
Basophils Absolute: 0 10*3/uL (ref 0.0–0.2)
Basos: 1 %
EOS (ABSOLUTE): 0.1 10*3/uL (ref 0.0–0.4)
Eos: 3 %
Hematocrit: 42.9 % (ref 34.0–46.6)
Hemoglobin: 14.6 g/dL (ref 11.1–15.9)
Immature Grans (Abs): 0 10*3/uL (ref 0.0–0.1)
Immature Granulocytes: 0 %
Lymphocytes Absolute: 1.2 10*3/uL (ref 0.7–3.1)
Lymphs: 25 %
MCH: 32.8 pg (ref 26.6–33.0)
MCHC: 34 g/dL (ref 31.5–35.7)
MCV: 96 fL (ref 79–97)
Monocytes Absolute: 0.6 10*3/uL (ref 0.1–0.9)
Monocytes: 12 %
Neutrophils Absolute: 2.9 10*3/uL (ref 1.4–7.0)
Neutrophils: 59 %
Platelets: 221 10*3/uL (ref 150–450)
RBC: 4.45 x10E6/uL (ref 3.77–5.28)
RDW: 11.9 % (ref 11.7–15.4)
WBC: 4.9 10*3/uL (ref 3.4–10.8)

## 2023-06-17 LAB — COMPREHENSIVE METABOLIC PANEL
ALT: 20 IU/L (ref 0–32)
AST: 26 IU/L (ref 0–40)
Albumin: 5.1 g/dL — ABNORMAL HIGH (ref 3.9–4.9)
Alkaline Phosphatase: 76 IU/L (ref 44–121)
BUN/Creatinine Ratio: 23 (ref 12–28)
BUN: 18 mg/dL (ref 8–27)
Bilirubin Total: 0.7 mg/dL (ref 0.0–1.2)
CO2: 22 mmol/L (ref 20–29)
Calcium: 10.2 mg/dL (ref 8.7–10.3)
Chloride: 100 mmol/L (ref 96–106)
Creatinine, Ser: 0.78 mg/dL (ref 0.57–1.00)
Globulin, Total: 1.8 g/dL (ref 1.5–4.5)
Glucose: 92 mg/dL (ref 70–99)
Potassium: 4.1 mmol/L (ref 3.5–5.2)
Sodium: 139 mmol/L (ref 134–144)
Total Protein: 6.9 g/dL (ref 6.0–8.5)
eGFR: 84 mL/min/{1.73_m2} (ref >59)

## 2023-06-17 LAB — TSH: TSH: 3.96 u[IU]/mL (ref 0.450–4.500)

## 2023-06-17 LAB — VITAMIN D 25 HYDROXY (VIT D DEFICIENCY, FRACTURES): Vit D, 25-Hydroxy: 62.6 ng/mL (ref 30.0–100.0)

## 2023-06-17 NOTE — Progress Notes (Signed)
Contacted via MyChart The 10-year ASCVD risk score (Arnett DK, et al., 2019) is: 6.8%   Values used to calculate the score:     Age: 67 years     Sex: Female     Is Non-Hispanic African American: No     Diabetic: No     Tobacco smoker: No     Systolic Blood Pressure: 122 mmHg     Is BP treated: No     HDL Cholesterol: 43 mg/dL     Total Cholesterol: 241 mg/dL   Good afternoon Christina Manning, your labs have returned and overall continue to look great with exception of cholesterol levels, which I know you prefer diet focus for.  Your cholesterol is still high, but continue to make lifestyle changes. Your LDL is above normal. The LDL is the bad cholesterol. Over time and in combination with inflammation and other factors, this contributes to plaque which in turn may lead to stroke and/or heart attack down the road. Sometimes high LDL is primarily genetic, and people might be eating all the right foods but still have high numbers. Other times, there is room for improvement in one's diet and eating healthier can bring this number down and potentially reduce one's risk of heart attack and/or stroke.   To reduce your LDL, Remember - more fruits and vegetables, more fish, and limit red meat and dairy products. More soy, nuts, beans, barley, lentils, oats and plant sterol ester enriched margarine instead of butter. I also encourage eliminating sugar and processed food. Remember, shop on the outside of the grocery store and visit your [employer]. Any questions? Keep being inspiring!!  Thank you for allowing me to participate in your care.  I appreciate you. Kindest regards, Dusten Ellinwood

## 2024-05-09 ENCOUNTER — Other Ambulatory Visit: Payer: Self-pay | Admitting: Nurse Practitioner

## 2024-05-09 DIAGNOSIS — Z1231 Encounter for screening mammogram for malignant neoplasm of breast: Secondary | ICD-10-CM

## 2024-05-17 DIAGNOSIS — Z1211 Encounter for screening for malignant neoplasm of colon: Secondary | ICD-10-CM | POA: Diagnosis not present

## 2024-05-17 DIAGNOSIS — Z1212 Encounter for screening for malignant neoplasm of rectum: Secondary | ICD-10-CM | POA: Diagnosis not present

## 2024-05-17 LAB — COLOGUARD: Cologuard: NEGATIVE

## 2024-05-23 LAB — COLOGUARD: COLOGUARD: NEGATIVE

## 2024-05-31 ENCOUNTER — Ambulatory Visit
Admission: RE | Admit: 2024-05-31 | Discharge: 2024-05-31 | Disposition: A | Discharge status: Home / Self Care | Source: Ambulatory Visit | Attending: Nurse Practitioner | Admitting: Nurse Practitioner

## 2024-05-31 DIAGNOSIS — Z1231 Encounter for screening mammogram for malignant neoplasm of breast: Secondary | ICD-10-CM | POA: Diagnosis not present

## 2024-06-05 ENCOUNTER — Ambulatory Visit: Payer: Self-pay | Admitting: Nurse Practitioner

## 2024-06-05 NOTE — Progress Notes (Signed)
 Contacted via MyChart   Normal mammogram, may repeat in one year:)

## 2024-06-16 NOTE — Patient Instructions (Signed)
 Be Involved in Caring For Your Health:  Taking Medications When medications are taken as directed, they can greatly improve your health. But if they are not taken as prescribed, they may not work. In some cases, not taking them correctly can be harmful. To help ensure your treatment remains effective and safe, understand your medications and how to take them. Bring your medications to each visit for review by your provider.  Your lab results, notes, and after visit summary will be available on My Chart. We strongly encourage you to use this feature. If lab results are abnormal the clinic will contact you with the appropriate steps. If the clinic does not contact you assume the results are satisfactory. You can always view your results on My Chart. If you have questions regarding your health or results, please contact the clinic during office hours. You can also ask questions on My Chart.  We at [facility] are grateful that you chose us  to provide your care. We strive to provide evidence-based and compassionate care and are always looking for feedback. If you get a survey from the clinic please complete this so we can hear your opinions.  Healthy Eating, Adult Healthy eating may help you get and keep a healthy body weight, reduce the risk of chronic disease, and live a long and productive life. It is important to follow a healthy eating pattern. Your nutritional and calorie needs should be met mainly by different nutrient-rich foods. What are tips for following this plan? Reading food labels Read labels and choose the following: Reduced or low sodium products. Juices with 100% fruit juice. Foods with low saturated fats (<3 g per serving) and high polyunsaturated and monounsaturated fats. Foods with whole grains, such as whole wheat, cracked wheat, brown rice, and wild rice. Whole grains that are fortified with folic acid. This is recommended for females who are pregnant or who want to  become pregnant. Read labels and do not eat or drink the following: Foods or drinks with added sugars. These include foods that contain brown sugar, corn sweetener, corn syrup, dextrose , fructose, glucose, high-fructose corn syrup, honey, invert sugar, lactose, malt syrup, maltose, molasses, raw sugar, sucrose, trehalose, or turbinado sugar. Limit your intake of added sugars to less than 10% of your total daily calories. Do not eat more than the following amounts of added sugar per day: 6 teaspoons (25 g) for females. 9 teaspoons (38 g) for males. Foods that contain processed or refined starches and grains. Refined grain products, such as white flour, degermed cornmeal, white bread, and white rice. Shopping Choose nutrient-rich snacks, such as vegetables, whole fruits, and nuts. Avoid high-calorie and high-sugar snacks, such as potato chips, fruit snacks, and candy. Use oil-based dressings and spreads on foods instead of solid fats such as butter, margarine, sour cream, or cream cheese. Limit pre-made sauces, mixes, and instant products such as flavored rice, instant noodles, and ready-made pasta. Try more plant-protein sources, such as tofu, tempeh, black beans, edamame, lentils, nuts, and seeds. Explore eating plans such as the Mediterranean diet or vegetarian diet. Try heart-healthy dips made with beans and healthy fats like hummus and guacamole. Vegetables go great with these. Cooking Use oil to saut or stir-fry foods instead of solid fats such as butter, margarine, or lard. Try baking, boiling, grilling, or broiling instead of frying. Remove the fatty part of meats before cooking. Steam vegetables in water  or broth. Meal planning  At meals, imagine dividing your plate into fourths: One-half of  your plate is fruits and vegetables. One-fourth of your plate is whole grains. One-fourth of your plate is protein, especially lean meats, poultry, eggs, tofu, beans, or nuts. Include low-fat  dairy as part of your daily diet. Lifestyle Choose healthy options in all settings, including home, work, school, restaurants, or stores. Prepare your food safely: Wash your hands after handling raw meats. Where you prepare food, keep surfaces clean by regularly washing with hot, soapy water . Keep raw meats separate from ready-to-eat foods, such as fruits and vegetables. Cook seafood, meat, poultry, and eggs to the recommended temperature. Get a food thermometer. Store foods at safe temperatures. In general: Keep cold foods at 84F (4.4C) or below. Keep hot foods at 184F (60C) or above. Keep your freezer at [facility] (-17.8C) or below. Foods are not safe to eat if they have been between the temperatures of 40-184F (4.4-60C) for more than 2 hours. What foods should I eat? Fruits Aim to eat 1-2 cups of fresh, canned (in natural juice), or frozen fruits each day. One cup of fruit equals 1 small apple, 1 large banana, 8 large strawberries, 1 cup (237 g) canned fruit,  cup (82 g) dried fruit, or 1 cup (240 mL) 100% juice. Vegetables Aim to eat 2-4 cups of fresh and frozen vegetables each day, including different varieties and colors. One cup of vegetables equals 1 cup (91 g) broccoli or cauliflower florets, 2 medium carrots, 2 cups (150 g) raw, leafy greens, 1 large tomato, 1 large bell pepper, 1 large sweet potato, or 1 medium white potato. Grains Aim to eat 5-10 ounce-equivalents of whole grains each day. Examples of 1 ounce-equivalent of grains include 1 slice of bread, 1 cup (40 g) ready-to-eat cereal, 3 cups (24 g) popcorn, or  cup (93 g) cooked rice. Meats and other proteins Try to eat 5-7 ounce-equivalents of protein each day. Examples of 1 ounce-equivalent of protein include 1 egg,  oz nuts (12 almonds, 24 pistachios, or 7 walnut halves), 1/4 cup (90 g) cooked beans, 6 tablespoons (90 g) hummus or 1 tablespoon (16 g) peanut butter. A cut of meat or fish that is the size of a deck of  cards is about 3-4 ounce-equivalents (85 g). Of the protein you eat each week, try to have at least 8 sounce (227 g) of seafood. This is about 2 servings per week. This includes salmon, trout, herring, sardines, and anchovies. Dairy Aim to eat 3 cup-equivalents of fat-free or low-fat dairy each day. Examples of 1 cup-equivalent of dairy include 1 cup (240 mL) milk, 8 ounces (250 g) yogurt, 1 ounces (44 g) natural cheese, or 1 cup (240 mL) fortified soy milk. Fats and oils Aim for about 5 teaspoons (21 g) of fats and oils per day. Choose monounsaturated fats, such as canola and olive oils, mayonnaise made with olive oil or avocado oil, avocados, peanut butter, and most nuts, or polyunsaturated fats, such as sunflower, corn, and soybean oils, walnuts, pine nuts, sesame seeds, sunflower seeds, and flaxseed. Beverages Aim for 6 eight-ounce glasses of water  per day. Limit coffee to 3-5 eight-ounce cups per day. Limit caffeinated beverages that have added calories, such as soda and energy drinks. If you drink alcohol: Limit how much you have to: 0-1 drink a day if you are female. 0-2 drinks a day if you are female. Know how much alcohol is in your drink. In the [place] one drink is one 12 oz bottle of beer (355 mL), one 5 oz glass of wine (  148 mL), or one 1 oz glass of hard liquor (44 mL). Seasoning and other foods Try not to add too much salt to your food. Try using herbs and spices instead of salt. Try not to add sugar to food. This information is based on [place] nutrition guidelines. To learn more, visit [employer].be. Exact amounts may vary. You may need different amounts. This information is not intended to replace advice given to you by your health care provider. Make sure you discuss any questions you have with your health care provider. Document Revised: 06/14/2022 Document Reviewed: 06/14/2022 Elsevier Patient Education  2024 [employer].

## 2024-06-18 ENCOUNTER — Ambulatory Visit (INDEPENDENT_AMBULATORY_CARE_PROVIDER_SITE_OTHER): Payer: Self-pay | Admitting: Nurse Practitioner

## 2024-06-18 ENCOUNTER — Encounter: Payer: Self-pay | Admitting: Nurse Practitioner

## 2024-06-18 VITALS — BP 126/80 | HR 88 | Temp 98.4°F | Resp 15 | Ht 64.02 in | Wt 139.6 lb

## 2024-06-18 DIAGNOSIS — R030 Elevated blood-pressure reading, without diagnosis of hypertension: Secondary | ICD-10-CM | POA: Diagnosis not present

## 2024-06-18 DIAGNOSIS — Z23 Encounter for immunization: Secondary | ICD-10-CM

## 2024-06-18 DIAGNOSIS — E7800 Pure hypercholesterolemia, unspecified: Secondary | ICD-10-CM

## 2024-06-18 DIAGNOSIS — M85832 Other specified disorders of bone density and structure, left forearm: Secondary | ICD-10-CM

## 2024-06-18 DIAGNOSIS — Z0000 Encounter for general adult medical examination without abnormal findings: Secondary | ICD-10-CM

## 2024-06-18 NOTE — Assessment & Plan Note (Signed)
 Noted on past labs from 2018, recommend diet focus at home and regular activity.  Lipid panel today.  The 10-year ASCVD risk score (Arnett DK, et al., 2019) is: 7.9%   Values used to calculate the score:     Age: 68 years     Clincally relevant sex: Female     Is Non-Hispanic African American: No     Diabetic: No     Tobacco smoker: No     Systolic Blood Pressure: 126 mmHg     Is BP treated: No     HDL Cholesterol: 43 mg/dL     Total Cholesterol: 241 mg/dL

## 2024-06-18 NOTE — Progress Notes (Signed)
 BP 126/80 (BP Location: Left Arm, Patient Position: Sitting, Cuff Size: Normal)   Pulse 88   Temp 98.4 F (36.9 C) (Oral)   Resp 15   Ht 5' 4.02 (1.626 m)   Wt 139 lb 9.6 oz (63.3 kg)   SpO2 98%   BMI 23.95 kg/m    Subjective:    Patient ID: Christina Manning, female    DOB: [birth year], 68 y.o.   MRN: [id number]  HPI: Christina Manning is a 68 y.o. female presenting on 06/18/2024 for comprehensive medical examination. Current medical complaints include:none  She currently lives with: husband Menopausal Symptoms: no  BP checks at home average (checks routinely at home) = last one was 112/68.  Gets white coat syndrome in office.  Highest this past year on SBP was 134.    Takes supplements, prefers not to start medication. The 10-year ASCVD risk score (Arnett DK, et al., 2019) is: 7.9%   Values used to calculate the score:     Age: 82 years     Clincally relevant sex: Female     Is Non-Hispanic African American: No     Diabetic: No     Tobacco smoker: No     Systolic Blood Pressure: 126 mmHg     Is BP treated: No     HDL Cholesterol: 43 mg/dL     Total Cholesterol: 241 mg/dL  Depression Screen done today and results listed below:     06/18/2024   10:23 AM 06/16/2023   10:29 AM 11/08/2022    3:04 PM 08/25/2022    9:39 AM 06/14/2022   10:38 AM  Depression screen PHQ 2/9  Decreased Interest 0 0 0 0 0  Down, Depressed, Hopeless 0 0 0 0 0  PHQ - 2 Score 0 0 0 0 0  Altered sleeping  0 0 0 0  Tired, decreased energy  0 0 0 1  Change in appetite  0 0 0 0  Feeling bad or failure about yourself   0 0 0 0  Trouble concentrating  0 0 0 0  Moving slowly or fidgety/restless  0 0 0 0  Suicidal thoughts  0 0 0 0  PHQ-9 Score  0 0 0 1  Difficult doing work/chores  Not difficult at all Not difficult at all Not difficult at all Not difficult at all      06/16/2023   10:29 AM 08/25/2022    9:39 AM 06/14/2022   10:38 AM 09/14/2021    2:32 PM  GAD 7 : Generalized Anxiety Score   Nervous, Anxious, on Edge 0 0 0 0  Control/stop worrying 0 0 0 0  Worry too much - different things 0 0 0 0  Trouble relaxing 0 0 0 0  Restless 0 0 0 0  Easily annoyed or irritable 0 0 0 0  Afraid - awful might happen 0 0 0 0  Total GAD 7 Score 0 0 0 0  Anxiety Difficulty Not difficult at all Not difficult at all Not difficult at all Not difficult at all      06/18/2024   10:24 AM 06/16/2023   10:29 AM 11/08/2022    3:06 PM 08/25/2022    9:39 AM 06/14/2022   10:38 AM  Fall Risk   Falls in the past year? 0 0 0 0 0  Number falls in past yr: 0 0 0 0 0  Injury with Fall? 0 0 0 0 0  Risk for fall due to :  No Fall Risks No Fall Risks No Fall Risks No Fall Risks No Fall Risks  Follow up Falls evaluation completed Falls evaluation completed Falls prevention discussed;Falls evaluation completed Falls evaluation completed  Falls evaluation completed      Data saved with a previous flowsheet row definition    Functional Status Survey: Is the patient deaf or have difficulty hearing?: No Does the patient have difficulty seeing, even when wearing glasses/contacts?: No Does the patient have difficulty concentrating, remembering, or making decisions?: No Does the patient have difficulty walking or climbing stairs?: No Does the patient have difficulty dressing or bathing?: No Does the patient have difficulty doing errands alone such as visiting a doctor's office or shopping?: No   Past Medical History:  Past Medical History:  Diagnosis Date   Actinic keratosis 10/20/2021   L lat calf   Allergy 1976   sulfa drugs; swelling   Ectopic pregnancy, tubal    Fibroids     Surgical History:  Past Surgical History:  Procedure Laterality Date   ABDOMINAL HYSTERECTOMY     DILATION AND CURETTAGE OF UTERUS     Fallopian Tube Surgery     OOPHORECTOMY     WISDOM TOOTH EXTRACTION      Medications:  Current Outpatient Medications on File Prior to Visit  Medication Sig   ascorbic acid (VITAMIN C)  500 MG tablet Take by mouth.   Coenzyme Q10 10 MG capsule Take by mouth.   glucosamine-chondroitin 500-400 MG tablet Take by mouth.   Magnesium Gluconate 550 MG TABS Take by mouth.   Multiple Vitamin (MULTI-VITAMINS) TABS Take 1 tablet by mouth daily.   Omega-3 1000 MG CAPS Take by mouth.   Zinc 50 MG TABS Take 50 mg by mouth daily.   No current facility-administered medications on file prior to visit.    Allergies:  Allergies  Allergen Reactions   Sulfa Antibiotics Swelling    Social History:  Social History   Socioeconomic History   Marital status: Married    Spouse name: Not on file   Number of children: 3   Years of education: Not on file   Highest education level: Bachelor's degree (e.g., BA, AB, BS)  Occupational History   Not on file  Tobacco Use   Smoking status: Never   Smokeless tobacco: Never  Vaping Use   Vaping status: Never Used  Substance and Sexual Activity   Alcohol use: Never   Drug use: Never   Sexual activity: Not Currently    Birth control/protection: None  Other Topics Concern   Not on file  Social History Narrative   Not on file   Social Drivers of Health   Financial Resource Strain: Low Risk  (06/14/2024)   Overall Financial Resource Strain (CARDIA)    Difficulty of Paying Living Expenses: Not hard at all  Food Insecurity: No Food Insecurity (06/14/2024)   Hunger Vital Sign    Worried About Running Out of Food in the Last Year: Never true    Ran Out of Food in the Last Year: Never true  Transportation Needs: No Transportation Needs (06/14/2024)   PRAPARE - Administrator, Civil Service (Medical): No    Lack of Transportation (Non-Medical): No  Physical Activity: Sufficiently Active (06/14/2024)   Exercise Vital Sign    Days of Exercise per Week: 7 days    Minutes of Exercise per Session: 150+ min  Stress: No Stress Concern Present (06/14/2024)   [employer] of Occupational Health - Occupational  Stress Questionnaire     Feeling of Stress: Only a little  Social Connections: Socially Isolated (06/14/2024)   Social Connection and Isolation Panel    Frequency of Communication with Friends and Family: Never    Frequency of Social Gatherings with Friends and Family: Never    Attends Religious Services: Never    Database administrator or Organizations: No    Attends Engineer, structural: Not on file    Marital Status: Married  Catering manager Violence: Not At Risk (06/18/2024)   Humiliation, Afraid, Rape, and Kick questionnaire    Fear of Current or Ex-Partner: No    Emotionally Abused: No    Physically Abused: No    Sexually Abused: No   Social History   Tobacco Use  Smoking Status Never  Smokeless Tobacco Never   Social History   Substance and Sexual Activity  Alcohol Use Never    Family History:  Family History  Problem Relation Age of Onset   Mental illness Mother    Non-Hodgkin's lymphoma Mother    Brain cancer Mother    Lupus Father    Heart Problems Daughter    Healthy Brother    Healthy Son    Healthy Son    Breast cancer Neg Hx     Past medical history, surgical history, medications, allergies, family history and social history reviewed with patient today and changes made to appropriate areas of the chart.   ROS All other ROS negative except what is listed above and in the HPI.      Objective:    BP 126/80 (BP Location: Left Arm, Patient Position: Sitting, Cuff Size: Normal)   Pulse 88   Temp 98.4 F (36.9 C) (Oral)   Resp 15   Ht 5' 4.02 (1.626 m)   Wt 139 lb 9.6 oz (63.3 kg)   SpO2 98%   BMI 23.95 kg/m   Wt Readings from Last 3 Encounters:  06/18/24 139 lb 9.6 oz (63.3 kg)  06/16/23 148 lb (67.1 kg)  11/08/22 150 lb (68 kg)    Physical Exam Vitals and nursing note reviewed.  Constitutional:      General: She is awake. She is not in acute distress.    Appearance: She is well-developed and well-groomed. She is not ill-appearing or toxic-appearing.   HENT:     Head: Normocephalic and atraumatic.     Right Ear: Hearing, tympanic membrane, ear canal and external ear normal. No drainage.     Left Ear: Hearing, tympanic membrane, ear canal and external ear normal. No drainage.     Nose: Nose normal.     Right Sinus: No maxillary sinus tenderness or frontal sinus tenderness.     Left Sinus: No maxillary sinus tenderness or frontal sinus tenderness.     Mouth/Throat:     Mouth: Mucous membranes are moist.     Pharynx: Oropharynx is clear. Uvula midline. No pharyngeal swelling, oropharyngeal exudate or posterior oropharyngeal erythema.  Eyes:     General: Lids are normal.        Right eye: No discharge.        Left eye: No discharge.     Extraocular Movements: Extraocular movements intact.     Conjunctiva/sclera: Conjunctivae normal.     Pupils: Pupils are equal, round, and reactive to light.     Visual Fields: Right eye visual fields normal and left eye visual fields normal.  Neck:     Thyroid : No thyromegaly.  Vascular: No carotid bruit.     Trachea: Trachea normal.  Cardiovascular:     Rate and Rhythm: Normal rate and regular rhythm.     Heart sounds: Normal heart sounds. No murmur heard.    No gallop.  Pulmonary:     Effort: Pulmonary effort is normal. No accessory muscle usage or respiratory distress.     Breath sounds: Normal breath sounds.  Abdominal:     General: Bowel sounds are normal.     Palpations: Abdomen is soft. There is no hepatomegaly or splenomegaly.     Tenderness: There is no abdominal tenderness.  Musculoskeletal:        General: Normal range of motion.     Cervical back: Normal range of motion and neck supple.     Right lower leg: No edema.     Left lower leg: No edema.  Lymphadenopathy:     Head:     Right side of head: No submental, submandibular, tonsillar, preauricular or posterior auricular adenopathy.     Left side of head: No submental, submandibular, tonsillar, preauricular or posterior  auricular adenopathy.     Cervical: No cervical adenopathy.  Skin:    General: Skin is warm and dry.     Capillary Refill: Capillary refill takes less than 2 seconds.     Findings: No rash.  Neurological:     Mental Status: She is alert and oriented to person, place, and time.     Gait: Gait is intact.     Deep Tendon Reflexes: Reflexes are normal and symmetric.     Reflex Scores:      Brachioradialis reflexes are 2+ on the right side and 2+ on the left side.      Patellar reflexes are 2+ on the right side and 2+ on the left side. Psychiatric:        Attention and Perception: Attention normal.        Mood and Affect: Mood normal.        Speech: Speech normal.        Behavior: Behavior normal. Behavior is cooperative.        Thought Content: Thought content normal.        Judgment: Judgment normal.    Results for orders placed or performed in visit on 06/08/24  Cologuard   Collection Time: 05/17/24  8:50 AM  Result Value Ref Range   Cologuard Negative Negative      Assessment & Plan:   Problem List Items Addressed This Visit       Cardiovascular and Mediastinum   White coat syndrome without diagnosis of hypertension - Primary   Chronic, ongoing issue for years even at dental visits, she checks BP at home regularly and is often <130/80.  BP trended down on recheck today after sitting for a period.  Recommend she continue to monitor at home and focus on DASH diet.  LABS today: CBC, CMP, TSH, lipid.  Initiate medication as needed.      Relevant Orders   CBC with Differential/Platelet   TSH     Musculoskeletal and Integument   Osteopenia of left forearm   Noted on DEXA imaging, she wishes to repeat this year if possible -- discussed this may not be covered and she will check on this.  Continue Vitamin D3 and calcium rich diet at home.        Relevant Orders   VITAMIN D  25 Hydroxy (Vit-D Deficiency, Fractures)     Other   Elevated  low density lipoprotein (LDL)  cholesterol level   Noted on past labs from 2018, recommend diet focus at home and regular activity.  Lipid panel today.  The 10-year ASCVD risk score (Arnett DK, et al., 2019) is: 7.9%   Values used to calculate the score:     Age: 68 years     Clincally relevant sex: Female     Is Non-Hispanic African American: No     Diabetic: No     Tobacco smoker: No     Systolic Blood Pressure: 126 mmHg     Is BP treated: No     HDL Cholesterol: 43 mg/dL     Total Cholesterol: 241 mg/dL       Relevant Orders   Comprehensive metabolic panel with GFR   Lipid Panel w/o Chol/HDL Ratio   Other Visit Diagnoses       Flu vaccine need       Flu vaccine today, educated on this.     Encounter for annual physical exam       Annual physical today with labs and health maintenance reviewed, discussed with patient.        Follow up plan: Return in about 1 year (around 06/18/2025) for Annual Physical.   LABORATORY TESTING:  - Pap smear: not applicable  IMMUNIZATIONS:   - Tdap: Tetanus vaccination status reviewed: Up To Date - Pneumovax: Up To Date - Prevnar: Not applicable - HPV: Not applicable - Zostavax vaccine: has had one dose  SCREENING: -Mammogram: Up to date -- next due 9/426 - Colonoscopy: Up to date --Cologuard negative, next due 05/18/27 - Bone Density: Up to date -- osteopenia -- repeat 06/24/2026 -Hearing Test: Not applicable  -Spirometry: Not applicable   PATIENT COUNSELING:   Advised to take 1 mg of folate supplement per day if capable of pregnancy.   Sexuality: Discussed sexually transmitted diseases, partner selection, use of condoms, avoidance of unintended pregnancy  and contraceptive alternatives.   Advised to avoid cigarette smoking.  I discussed with the patient that most people either abstain from alcohol or drink within safe limits (<=14/week and <=4 drinks/occasion for males, <=7/weeks and <= 3 drinks/occasion for females) and that the risk for alcohol disorders  and other health effects rises proportionally with the number of drinks per week and how often a drinker exceeds daily limits.  Discussed cessation/primary prevention of drug use and availability of treatment for abuse.   Diet: Encouraged to adjust caloric intake to maintain  or achieve ideal body weight, to reduce intake of dietary saturated fat and total fat, to limit sodium intake by avoiding high sodium foods and not adding table salt, and to maintain adequate dietary potassium and calcium preferably from fresh fruits, vegetables, and low-fat dairy products.    Stressed the importance of regular exercise  Injury prevention: Discussed safety belts, safety helmets, smoke detector, smoking near bedding or upholstery.   Dental health: Discussed importance of regular tooth brushing, flossing, and dental visits.    NEXT PREVENTATIVE PHYSICAL DUE IN 1 YEAR. Return in about 1 year (around 06/18/2025) for Annual Physical.

## 2024-06-18 NOTE — Assessment & Plan Note (Signed)
Noted on DEXA imaging, she wishes to repeat this year if possible -- discussed this may not be covered and she will check on this.  Continue Vitamin D3 and calcium rich diet at home.

## 2024-06-18 NOTE — Assessment & Plan Note (Signed)
 Chronic, ongoing issue for years even at dental visits, she checks BP at home regularly and is often <130/80.  BP trended down on recheck today after sitting for a period.  Recommend she continue to monitor at home and focus on DASH diet.  LABS today: CBC, CMP, TSH, lipid.  Initiate medication as needed.

## 2024-06-19 ENCOUNTER — Ambulatory Visit: Payer: Self-pay | Admitting: Nurse Practitioner

## 2024-06-19 LAB — COMPREHENSIVE METABOLIC PANEL WITH GFR
ALT: 29 IU/L (ref 0–32)
AST: 32 IU/L (ref 0–40)
Albumin: 5.1 g/dL — ABNORMAL HIGH (ref 3.9–4.9)
Alkaline Phosphatase: 59 IU/L (ref 49–135)
BUN/Creatinine Ratio: 28 (ref 12–28)
BUN: 22 mg/dL (ref 8–27)
Bilirubin Total: 1 mg/dL (ref 0.0–1.2)
CO2: 23 mmol/L (ref 20–29)
Calcium: 10.3 mg/dL (ref 8.7–10.3)
Chloride: 102 mmol/L (ref 96–106)
Creatinine, Ser: 0.8 mg/dL (ref 0.57–1.00)
Globulin, Total: 2.1 g/dL (ref 1.5–4.5)
Glucose: 90 mg/dL (ref 70–99)
Potassium: 4.5 mmol/L (ref 3.5–5.2)
Sodium: 142 mmol/L (ref 134–144)
Total Protein: 7.2 g/dL (ref 6.0–8.5)
eGFR: 81 mL/min/1.73 (ref >59)

## 2024-06-19 LAB — CBC WITH DIFFERENTIAL/PLATELET
Basophils Absolute: 0 x10E3/uL (ref 0.0–0.2)
Basos: 1 %
EOS (ABSOLUTE): 0.1 x10E3/uL (ref 0.0–0.4)
Eos: 2 %
Hematocrit: 45.9 % (ref 34.0–46.6)
Hemoglobin: 15.2 g/dL (ref 11.1–15.9)
Immature Grans (Abs): 0 x10E3/uL (ref 0.0–0.1)
Immature Granulocytes: 0 %
Lymphocytes Absolute: 1.1 x10E3/uL (ref 0.7–3.1)
Lymphs: 19 %
MCH: 32.3 pg (ref 26.6–33.0)
MCHC: 33.1 g/dL (ref 31.5–35.7)
MCV: 98 fL — ABNORMAL HIGH (ref 79–97)
Monocytes Absolute: 0.6 x10E3/uL (ref 0.1–0.9)
Monocytes: 10 %
Neutrophils Absolute: 3.8 x10E3/uL (ref 1.4–7.0)
Neutrophils: 67 %
Platelets: 227 x10E3/uL (ref 150–450)
RBC: 4.7 x10E6/uL (ref 3.77–5.28)
RDW: 12.1 % (ref 11.7–15.4)
WBC: 5.7 x10E3/uL (ref 3.4–10.8)

## 2024-06-19 LAB — TSH: TSH: 2.59 u[IU]/mL (ref 0.450–4.500)

## 2024-06-19 LAB — LIPID PANEL W/O CHOL/HDL RATIO
Cholesterol, Total: 173 mg/dL (ref 100–199)
HDL: 52 mg/dL (ref >39)
LDL Chol Calc (NIH): 105 mg/dL — ABNORMAL HIGH (ref 0–99)
Triglycerides: 89 mg/dL (ref 0–149)
VLDL Cholesterol Cal: 16 mg/dL (ref 5–40)

## 2024-06-19 LAB — VITAMIN D 25 HYDROXY (VIT D DEFICIENCY, FRACTURES): Vit D, 25-Hydroxy: 79.6 ng/mL (ref 30.0–100.0)

## 2024-06-19 NOTE — Progress Notes (Signed)
 Contacted via MyChart The 10-year ASCVD risk score (Arnett DK, et al., 2019) is: 6.4%   Values used to calculate the score:     Age: 68 years     Clincally relevant sex: Female     Is Non-Hispanic African American: No     Diabetic: No     Tobacco smoker: No     Systolic Blood Pressure: 126 mmHg     Is BP treated: No     HDL Cholesterol: 52 mg/dL     Total Cholesterol: 173 mg/dL

## 2025-06-20 ENCOUNTER — Encounter: Admitting: Nurse Practitioner
